# Patient Record
Sex: Female | Born: 1935 | Race: White | Hispanic: No | Marital: Single | Smoking: Never smoker
Health system: Southern US, Community
[De-identification: ages and names within clinical notes are randomized; demographics above are authoritative.]

## PROBLEM LIST (undated history)

## (undated) DIAGNOSIS — Z972 Presence of dental prosthetic device (complete) (partial): Secondary | ICD-10-CM

## (undated) DIAGNOSIS — I2510 Atherosclerotic heart disease of native coronary artery without angina pectoris: Secondary | ICD-10-CM

## (undated) DIAGNOSIS — I10 Essential (primary) hypertension: Secondary | ICD-10-CM

## (undated) DIAGNOSIS — M129 Arthropathy, unspecified: Secondary | ICD-10-CM

## (undated) DIAGNOSIS — I509 Heart failure, unspecified: Secondary | ICD-10-CM

## (undated) DIAGNOSIS — E119 Type 2 diabetes mellitus without complications: Secondary | ICD-10-CM

## (undated) DIAGNOSIS — I4891 Unspecified atrial fibrillation: Secondary | ICD-10-CM

## (undated) DIAGNOSIS — M810 Age-related osteoporosis without current pathological fracture: Secondary | ICD-10-CM

## (undated) DIAGNOSIS — M1990 Unspecified osteoarthritis, unspecified site: Secondary | ICD-10-CM

## (undated) HISTORY — PX: HX HYSTERECTOMY: SHX81

## (undated) HISTORY — PX: HX TONSILLECTOMY: SHX27

## (undated) HISTORY — PX: HX GALL BLADDER SURGERY/CHOLE: SHX55

## (undated) HISTORY — PX: HX APPENDECTOMY: SHX54

## (undated) HISTORY — PX: HX HEART CATHETERIZATION: SHX148

## (undated) HISTORY — PX: CORONARY ARTERY ANGIOPLASTY: PR CATH30428

## (undated) HISTORY — PX: HX CATARACT REMOVAL: SHX102

## (undated) HISTORY — PX: HX ADENOIDECTOMY: SHX29

## (undated) HISTORY — PX: HX AORTIC VALVE REPLACEMENT: SHX41

## (undated) HISTORY — PX: CHOLECYSTECTOMY: SHX55

## (undated) HISTORY — PX: CATARACT EXTRACTION: SUR2

## (undated) HISTORY — PX: ABDOMINAL HYSTERECTOMY: SHX81

---

## 2014-07-29 ENCOUNTER — Emergency Department (HOSPITAL_COMMUNITY)
Admission: EM | Admit: 2014-07-29 | Discharge: 2014-07-29 | Disposition: A | Discharge status: Home / Self Care | Payer: Managed Care, Other (non HMO) | Source: Home / Self Care | Attending: Family Medicine | Admitting: Family Medicine

## 2014-07-29 ENCOUNTER — Encounter (HOSPITAL_COMMUNITY): Payer: Self-pay | Admitting: Family Medicine

## 2014-07-29 DIAGNOSIS — M5431 Sciatica, right side: Secondary | ICD-10-CM

## 2014-07-29 HISTORY — DX: Unspecified osteoarthritis, unspecified site: M19.90

## 2014-07-29 HISTORY — DX: Type 2 diabetes mellitus without complications: E11.9

## 2014-07-29 HISTORY — DX: Essential (primary) hypertension: I10

## 2014-07-29 HISTORY — DX: Unspecified atrial fibrillation: I48.91

## 2014-07-29 MED ORDER — METHOCARBAMOL 500 MG PO TABS
500.0000 mg | ORAL_TABLET | Freq: Four times a day (QID) | ORAL | Status: DC | PRN
Start: 2014-07-29 — End: 2014-08-09

## 2014-07-29 MED ORDER — PREDNISONE 50 MG PO TABS
ORAL_TABLET | ORAL | Status: DC
Start: 2014-07-29 — End: 2014-08-09

## 2014-07-29 NOTE — ED Provider Notes (Signed)
CSN: [id number]     Arrival date & time 07/29/14  [id number] History   First MD Initiated Contact with Patient 07/29/14 1113     No chief complaint on file.  (Consider location/radiation/quality/duration/timing/severity/associated sxs/prior Treatment) HPI 10-14 days ago developed R hip pain. Radiates down buttock and R posterior thigh. Tried T#3 prescribed by PCP out of town w/ some benefit. Onset while making twisting movement in the bathroom. Worse w/ certain movements. Ambulation w/o difficulty Denies any loss of sensation or loss of bowel or bladder function. Pain is unchanged.  H/o arthritis in lower back  Takes meloxicam 15mg  daily for other arthritis pain.     Past Medical History  Diagnosis Date  . Arthritis   . Hypertension   . Diabetes mellitus without complication   . A-fib    Past Surgical History  Procedure Laterality Date  . Abdominal hysterectomy    . Cholecystectomy    . Cataract extraction     No family history on file. History  Substance Use Topics  . Smoking status: Never Smoker   . Smokeless tobacco: Not on file  . Alcohol Use: No   OB History    No data available     Review of Systems ,Per HPI with all other pertinent systems negative.  Allergies  Review of patient's allergies indicates not on file.  Home Medications   Prior to Admission medications   Medication Sig Start Date End Date Taking? Authorizing Provider  acetaminophen (TYLENOL) 325 MG tablet Take 650 mg by mouth every 6 (six) hours as needed.   Yes Historical Provider, MD  aspirin 81 MG tablet Take 81 mg by mouth daily.   Yes Historical Provider, MD  Cholecalciferol (VITAMIN D PO) Take 2,000 Units by mouth.   Yes Historical Provider, MD  diltiazem (DILTIAZEM CD) 240 MG 24 hr capsule Take 240 mg by mouth daily.   Yes Historical Provider, MD  Fish Oil OIL by Does not apply route.   Yes Historical Provider, MD  losartan-hydrochlorothiazide (HYZAAR) 100-25 MG per tablet Take 1 tablet by mouth  daily.   Yes Historical Provider, MD  meloxicam (MOBIC) 15 MG tablet Take 15 mg by mouth daily.   Yes Historical Provider, MD  Multiple Vitamins-Minerals (CENTRUM ADULTS) TABS Take by mouth.   Yes Historical Provider, MD  repaglinide (PRANDIN) 0.5 MG tablet Take 0.5 mg by mouth 3 (three) times daily before meals.   Yes Historical Provider, MD  saxagliptin HCl (ONGLYZA) 5 MG TABS tablet Take by mouth daily.   Yes Historical Provider, MD  vitamin C (ASCORBIC ACID) 500 MG tablet Take 500 mg by mouth daily.   Yes Historical Provider, MD  methocarbamol (ROBAXIN) 500 MG tablet Take 1-2 tablets (500-1,000 mg total) by mouth every 6 (six) hours as needed for muscle spasms. 07/29/14   Ozella Rocksavid J Richey Doolittle, MD  predniSONE (DELTASONE) 50 MG tablet Take daily with breakfast 07/29/14   Ozella Rocksavid J Taylynn Easton, MD   BP 170/85 mmHg  Pulse 75  Temp(Src) 97.8 F (36.6 C) (Oral)  Resp 16  SpO2 96% Physical Exam  ED Course  Procedures (including critical care time) Labs Review Labs Reviewed - No data to display  Imaging Review No results found.   MDM   1. Sciatica, right    Mild sciatica Start prednisone and robaxin, and exercises CHeck sugars and stop if hyperglycemia above 200 and symptomatic or above 300.  F/u in ED if symptoms worsen [place] will try to f/u w/ PCP  or Ortho in that area when returns home Precautions given and all questions answered  HTN: likely elevated due to pain and pressure taken while standing. Continue current medications  Shelly Flattenavid Latanza Pfefferkorn, MD Family Medicine 07/29/2014, 11:54 AM       Ozella Rocksavid J Javeah Loeza, MD 07/29/14 1155

## 2014-07-29 NOTE — Discharge Instructions (Signed)
You likely have right sided sciatica This will take time to resolve Please start the exercises outlined below Please start the steroids and muscle relaxers The steroids will make your sugar become elevated. Please stop the steroids if your sugar goes above 300 or above 200 and develop symptoms such as shaking, feeling ill, nausea, headaches.  If you do not improve or get worse (leg weakness), you may need to go to the emergency room for further evaluation and imaging. Eventually this may need formal physical therapy, injections, or possibly surgery.   Sciatica with Rehab The sciatic nerve runs from the back down the leg and is responsible for sensation and control of the muscles in the back (posterior) side of the thigh, lower leg, and foot. Sciatica is a condition that is characterized by inflammation of this nerve.  SYMPTOMS   Signs of nerve damage, including numbness and/or weakness along the posterior side of the lower extremity.  Pain in the back of the thigh that may also travel down the leg.  Pain that worsens when sitting for long periods of time.  Occasionally, pain in the back or buttock. CAUSES  Inflammation of the sciatic nerve is the cause of sciatica. The inflammation is due to something irritating the nerve. Common sources of irritation include:  Sitting for long periods of time.  Direct trauma to the nerve.  Arthritis of the spine.  Herniated or ruptured disk.  Slipping of the vertebrae (spondylolisthesis).  Pressure from soft tissues, such as muscles or ligament-like tissue (fascia). RISK INCREASES WITH:  Sports that place pressure or stress on the spine (football or weightlifting).  Poor strength and flexibility.  Failure to warm up properly before activity.  Family history of low back pain or disk disorders.  Previous back injury or surgery.  Poor body mechanics, especially when lifting, or poor posture. PREVENTION   Warm up and stretch properly  before activity.  Maintain physical fitness:  Strength, flexibility, and endurance.  Cardiovascular fitness.  Learn and use proper technique, especially with posture and lifting. When possible, have coach correct improper technique.  Avoid activities that place stress on the spine. PROGNOSIS If treated properly, then sciatica usually resolves within 6 weeks. However, occasionally surgery is necessary.  RELATED COMPLICATIONS   Permanent nerve damage, including pain, numbness, tingle, or weakness.  Chronic back pain.  Risks of surgery: infection, bleeding, nerve damage, or damage to surrounding tissues. TREATMENT Treatment initially involves resting from any activities that aggravate your symptoms. The use of ice and medication may help reduce pain and inflammation. The use of strengthening and stretching exercises may help reduce pain with activity. These exercises may be performed at home or with referral to a therapist. A therapist may recommend further treatments, such as transcutaneous electronic nerve stimulation (TENS) or ultrasound. Your caregiver may recommend corticosteroid injections to help reduce inflammation of the sciatic nerve. If symptoms persist despite non-surgical (conservative) treatment, then surgery may be recommended. MEDICATION  If pain medication is necessary, then nonsteroidal anti-inflammatory medications, such as aspirin and ibuprofen, or other minor pain relievers, such as acetaminophen, are often recommended.  Do not take pain medication for 7 days before surgery.  Prescription pain relievers may be given if deemed necessary by your caregiver. Use only as directed and only as much as you need.  Ointments applied to the skin may be helpful.  Corticosteroid injections may be given by your caregiver. These injections should be reserved for the most serious cases, because they may only be  given a certain number of times. HEAT AND COLD  Cold treatment  (icing) relieves pain and reduces inflammation. Cold treatment should be applied for 10 to 15 minutes every 2 to 3 hours for inflammation and pain and immediately after any activity that aggravates your symptoms. Use ice packs or massage the area with a piece of ice (ice massage).  Heat treatment may be used prior to performing the stretching and strengthening activities prescribed by your caregiver, physical therapist, or athletic trainer. Use a heat pack or soak the injury in warm water. SEEK MEDICAL CARE IF:  Treatment seems to offer no benefit, or the condition worsens.  Any medications produce adverse side effects. EXERCISES  RANGE OF MOTION (ROM) AND STRETCHING EXERCISES - Sciatica Most people with sciatic will find that their symptoms worsen with either excessive bending forward (flexion) or arching at the low back (extension). The exercises which will help resolve your symptoms will focus on the opposite motion. Your physician, physical therapist or athletic trainer will help you determine which exercises will be most helpful to resolve your low back pain. Do not complete any exercises without first consulting with your clinician. Discontinue any exercises which worsen your symptoms until you speak to your clinician. If you have pain, numbness or tingling which travels down into your buttocks, leg or foot, the goal of the therapy is for these symptoms to move closer to your back and eventually resolve. Occasionally, these leg symptoms will get better, but your low back pain may worsen; this is typically an indication of progress in your rehabilitation. Be certain to be very alert to any changes in your symptoms and the activities in which you participated in the 24 hours prior to the change. Sharing this information with your clinician will allow him/her to most efficiently treat your condition. These exercises may help you when beginning to rehabilitate your injury. Your symptoms may resolve with  or without further involvement from your physician, physical therapist or athletic trainer. While completing these exercises, remember:   Restoring tissue flexibility helps normal motion to return to the joints. This allows healthier, less painful movement and activity.  An effective stretch should be held for at least 30 seconds.  A stretch should never be painful. You should only feel a gentle lengthening or release in the stretched tissue. FLEXION RANGE OF MOTION AND STRETCHING EXERCISES: STRETCH - Flexion, Single Knee to Chest   Lie on a firm bed or floor with both legs extended in front of you.  Keeping one leg in contact with the floor, bring your opposite knee to your chest. Hold your leg in place by either grabbing behind your thigh or at your knee.  Pull until you feel a gentle stretch in your low back. Hold __________ seconds.  Slowly release your grasp and repeat the exercise with the opposite side. Repeat __________ times. Complete this exercise __________ times per day.  STRETCH - Flexion, Double Knee to Chest  Lie on a firm bed or floor with both legs extended in front of you.  Keeping one leg in contact with the floor, bring your opposite knee to your chest.  Tense your stomach muscles to support your back and then lift your other knee to your chest. Hold your legs in place by either grabbing behind your thighs or at your knees.  Pull both knees toward your chest until you feel a gentle stretch in your low back. Hold __________ seconds.  Tense your stomach muscles and slowly return  one leg at a time to the floor. Repeat __________ times. Complete this exercise __________ times per day.  STRETCH - Low Trunk Rotation   Lie on a firm bed or floor. Keeping your legs in front of you, bend your knees so they are both pointed toward the ceiling and your feet are flat on the floor.  Extend your arms out to the side. This will stabilize your upper body by keeping your  shoulders in contact with the floor.  Gently and slowly drop both knees together to one side until you feel a gentle stretch in your low back. Hold for __________ seconds.  Tense your stomach muscles to support your low back as you bring your knees back to the starting position. Repeat the exercise to the other side. Repeat __________ times. Complete this exercise __________ times per day  EXTENSION RANGE OF MOTION AND FLEXIBILITY EXERCISES: STRETCH - Extension, Prone on Elbows  Lie on your stomach on the floor, a bed will be too soft. Place your palms about shoulder width apart and at the height of your head.  Place your elbows under your shoulders. If this is too painful, stack pillows under your chest.  Allow your body to relax so that your hips drop lower and make contact more completely with the floor.  Hold this position for __________ seconds.  Slowly return to lying flat on the floor. Repeat __________ times. Complete this exercise __________ times per day.  RANGE OF MOTION - Extension, Prone Press Ups  Lie on your stomach on the floor, a bed will be too soft. Place your palms about shoulder width apart and at the height of your head.  Keeping your back as relaxed as possible, slowly straighten your elbows while keeping your hips on the floor. You may adjust the placement of your hands to maximize your comfort. As you gain motion, your hands will come more underneath your shoulders.  Hold this position __________ seconds.  Slowly return to lying flat on the floor. Repeat __________ times. Complete this exercise __________ times per day.  STRENGTHENING EXERCISES - Sciatica  These exercises may help you when beginning to rehabilitate your injury. These exercises should be done near your "sweet spot." This is the neutral, low-back arch, somewhere between fully rounded and fully arched, that is your least painful position. When performed in this safe range of motion, these exercises  can be used for people who have either a flexion or extension based injury. These exercises may resolve your symptoms with or without further involvement from your physician, physical therapist or athletic trainer. While completing these exercises, remember:   Muscles can gain both the endurance and the strength needed for everyday activities through controlled exercises.  Complete these exercises as instructed by your physician, physical therapist or athletic trainer. Progress with the resistance and repetition exercises only as your caregiver advises.  You may experience muscle soreness or fatigue, but the pain or discomfort you are trying to eliminate should never worsen during these exercises. If this pain does worsen, stop and make certain you are following the directions exactly. If the pain is still present after adjustments, discontinue the exercise until you can discuss the trouble with your clinician. STRENGTHENING - Deep Abdominals, Pelvic Tilt   Lie on a firm bed or floor. Keeping your legs in front of you, bend your knees so they are both pointed toward the ceiling and your feet are flat on the floor.  Tense your lower abdominal muscles to  press your low back into the floor. This motion will rotate your pelvis so that your tail bone is scooping upwards rather than pointing at your feet or into the floor.  With a gentle tension and even breathing, hold this position for __________ seconds. Repeat __________ times. Complete this exercise __________ times per day.  STRENGTHENING - Abdominals, Crunches   Lie on a firm bed or floor. Keeping your legs in front of you, bend your knees so they are both pointed toward the ceiling and your feet are flat on the floor. Cross your arms over your chest.  Slightly tip your chin down without bending your neck.  Tense your abdominals and slowly lift your trunk high enough to just clear your shoulder blades. Lifting higher can put excessive stress on  the low back and does not further strengthen your abdominal muscles.  Control your return to the starting position. Repeat __________ times. Complete this exercise __________ times per day.  STRENGTHENING - Quadruped, Opposite UE/LE Lift  Assume a hands and knees position on a firm surface. Keep your hands under your shoulders and your knees under your hips. You may place padding under your knees for comfort.  Find your neutral spine and gently tense your abdominal muscles so that you can maintain this position. Your shoulders and hips should form a rectangle that is parallel with the floor and is not twisted.  Keeping your trunk steady, lift your right hand no higher than your shoulder and then your left leg no higher than your hip. Make sure you are not holding your breath. Hold this position __________ seconds.  Continuing to keep your abdominal muscles tense and your back steady, slowly return to your starting position. Repeat with the opposite arm and leg. Repeat __________ times. Complete this exercise __________ times per day.  STRENGTHENING - Abdominals and Quadriceps, Straight Leg Raise   Lie on a firm bed or floor with both legs extended in front of you.  Keeping one leg in contact with the floor, bend the other knee so that your foot can rest flat on the floor.  Find your neutral spine, and tense your abdominal muscles to maintain your spinal position throughout the exercise.  Slowly lift your straight leg off the floor about 6 inches for a count of 15, making sure to not hold your breath.  Still keeping your neutral spine, slowly lower your leg all the way to the floor. Repeat this exercise with each leg __________ times. Complete this exercise __________ times per day. POSTURE AND BODY MECHANICS CONSIDERATIONS - Sciatica Keeping correct posture when sitting, standing or completing your activities will reduce the stress put on different body tissues, allowing injured tissues a  chance to heal and limiting painful experiences. The following are general guidelines for improved posture. Your physician or physical therapist will provide you with any instructions specific to your needs. While reading these guidelines, remember:  The exercises prescribed by your provider will help you have the flexibility and strength to maintain correct postures.  The correct posture provides the optimal environment for your joints to work. All of your joints have less wear and tear when properly supported by a spine with good posture. This means you will experience a healthier, less painful body.  Correct posture must be practiced with all of your activities, especially prolonged sitting and standing. Correct posture is as important when doing repetitive low-stress activities (typing) as it is when doing a single heavy-load activity (lifting). RESTING POSITIONS Consider  which positions are most painful for you when choosing a resting position. If you have pain with flexion-based activities (sitting, bending, stooping, squatting), choose a position that allows you to rest in a less flexed posture. You would want to avoid curling into a fetal position on your side. If your pain worsens with extension-based activities (prolonged standing, working overhead), avoid resting in an extended position such as sleeping on your stomach. Most people will find more comfort when they rest with their spine in a more neutral position, neither too rounded nor too arched. Lying on a non-sagging bed on your side with a pillow between your knees, or on your back with a pillow under your knees will often provide some relief. Keep in mind, being in any one position for a prolonged period of time, no matter how correct your posture, can still lead to stiffness. PROPER SITTING POSTURE In order to minimize stress and discomfort on your spine, you must sit with correct posture Sitting with good posture should be effortless for  a healthy body. Returning to good posture is a gradual process. Many people can work toward this most comfortably by using various supports until they have the flexibility and strength to maintain this posture on their own. When sitting with proper posture, your ears will fall over your shoulders and your shoulders will fall over your hips. You should use the back of the chair to support your upper back. Your low back will be in a neutral position, just slightly arched. You may place a small pillow or folded towel at the base of your low back for support.  When working at a desk, create an environment that supports good, upright posture. Without extra support, muscles fatigue and lead to excessive strain on joints and other tissues. Keep these recommendations in mind: CHAIR:   A chair should be able to slide under your desk when your back makes contact with the back of the chair. This allows you to work closely.  The chair's height should allow your eyes to be level with the upper part of your monitor and your hands to be slightly lower than your elbows. BODY POSITION  Your feet should make contact with the floor. If this is not possible, use a foot rest.  Keep your ears over your shoulders. This will reduce stress on your neck and low back. INCORRECT SITTING POSTURES   If you are feeling tired and unable to assume a healthy sitting posture, do not slouch or slump. This puts excessive strain on your back tissues, causing more damage and pain. Healthier options include:  Using more support, like a lumbar pillow.  Switching tasks to something that requires you to be upright or walking.  Talking a brief walk.  Lying down to rest in a neutral-spine position. PROLONGED STANDING WHILE SLIGHTLY LEANING FORWARD  When completing a task that requires you to lean forward while standing in one place for a long time, place either foot up on a stationary 2-4 inch high object to help maintain the best  posture. When both feet are on the ground, the low back tends to lose its slight inward curve. If this curve flattens (or becomes too large), then the back and your other joints will experience too much stress, fatigue more quickly and can cause pain.  CORRECT STANDING POSTURES Proper standing posture should be assumed with all daily activities, even if they only take a few moments, like when brushing your teeth. As in sitting, your ears  should fall over your shoulders and your shoulders should fall over your hips. You should keep a slight tension in your abdominal muscles to brace your spine. Your tailbone should point down to the ground, not behind your body, resulting in an over-extended swayback posture.  INCORRECT STANDING POSTURES  Common incorrect standing postures include a forward head, locked knees and/or an excessive swayback. WALKING Walk with an upright posture. Your ears, shoulders and hips should all line-up. PROLONGED ACTIVITY IN A FLEXED POSITION When completing a task that requires you to bend forward at your waist or lean over a low surface, try to find a way to stabilize 3 of 4 of your limbs. You can place a hand or elbow on your thigh or rest a knee on the surface you are reaching across. This will provide you more stability so that your muscles do not fatigue as quickly. By keeping your knees relaxed, or slightly bent, you will also reduce stress across your low back. CORRECT LIFTING TECHNIQUES DO :   Assume a wide stance. This will provide you more stability and the opportunity to get as close as possible to the object which you are lifting.  Tense your abdominals to brace your spine; then bend at the knees and hips. Keeping your back locked in a neutral-spine position, lift using your leg muscles. Lift with your legs, keeping your back straight.  Test the weight of unknown objects before attempting to lift them.  Try to keep your elbows locked down at your sides in order get  the best strength from your shoulders when carrying an object.  Always ask for help when lifting heavy or awkward objects. INCORRECT LIFTING TECHNIQUES DO NOT:   Lock your knees when lifting, even if it is a small object.  Bend and twist. Pivot at your feet or move your feet when needing to change directions.  Assume that you cannot safely pick up a paperclip without proper posture. Document Released: 08/15/2005 Document Revised: 12/30/2013 Document Reviewed: 11/27/2008 [facility] Patient Information 2015 [place], [place]. This information is not intended to replace advice given to you by your health care provider. Make sure you discuss any questions you have with your health care provider.

## 2014-08-09 ENCOUNTER — Encounter (HOSPITAL_COMMUNITY): Payer: Self-pay | Admitting: Emergency Medicine

## 2014-08-09 ENCOUNTER — Emergency Department (HOSPITAL_COMMUNITY)
Admission: EM | Admit: 2014-08-09 | Discharge: 2014-08-09 | Disposition: A | Discharge status: Home / Self Care | Payer: Managed Care, Other (non HMO) | Source: Home / Self Care | Attending: Family Medicine | Admitting: Family Medicine

## 2014-08-09 DIAGNOSIS — M25551 Pain in right hip: Secondary | ICD-10-CM

## 2014-08-09 DIAGNOSIS — M1990 Unspecified osteoarthritis, unspecified site: Secondary | ICD-10-CM

## 2014-08-09 DIAGNOSIS — M1611 Unilateral primary osteoarthritis, right hip: Secondary | ICD-10-CM

## 2014-08-09 DIAGNOSIS — M461 Sacroiliitis, not elsewhere classified: Secondary | ICD-10-CM

## 2014-08-09 MED ORDER — PREDNISONE 20 MG PO TABS: ORAL_TABLET | ORAL | Status: AC

## 2014-08-09 MED ORDER — METHOCARBAMOL 500 MG PO TABS: 500.0000 mg | ORAL_TABLET | Freq: Four times a day (QID) | ORAL | Status: AC

## 2014-08-09 NOTE — ED Notes (Signed)
C/o persistent lower back pain on right side; seen here on 12/01 Dx w/Sciatica; given prednisone and Robaxin w/no temp relief Alert, no signs of acute distress.

## 2014-08-09 NOTE — ED Provider Notes (Signed)
CSN: [id number]     Arrival date & time 08/09/14  1015 History   First MD Initiated Contact with Patient 08/09/14 1040     Chief Complaint  Patient presents with  . Back Pain   (Consider location/radiation/quality/duration/timing/severity/associated sxs/prior Treatment) HPI Comments: 80 year old well-preserved and self-sufficient female is complaining of persistent right posterior and lateral hip pain since mid November. She was seen in this urgent care on December 1 and was diagnosed with sciatica. She was prescribed prednisone and Robaxin which gave her good temporary relief. She is currently out of this medication and now complains of pain only upon arising in the morning when she goes to the bathroom. The pain last for up to an hour before resolving. During the day and early at night she does not experience pain. She is ambulatory with minimal discomfort to the anterolateral hip line. She is able to bear full weight.   Past Medical History  Diagnosis Date  . Arthritis   . Hypertension   . Diabetes mellitus without complication   . A-fib    Past Surgical History  Procedure Laterality Date  . Abdominal hysterectomy    . Cholecystectomy    . Cataract extraction     No family history on file. History  Substance Use Topics  . Smoking status: Never Smoker   . Smokeless tobacco: Not on file  . Alcohol Use: No   OB History    No data available     Review of Systems  Constitutional: Negative.   HENT: Negative.   Cardiovascular: Negative for chest pain.  Musculoskeletal: Positive for arthralgias. Negative for back pain and joint swelling.  Skin: Negative.   Neurological: Negative.     Allergies  Review of patient's allergies indicates no known allergies.  Home Medications   Prior to Admission medications   Medication Sig Start Date End Date Taking? Authorizing Provider  aspirin 81 MG tablet Take 81 mg by mouth daily.   Yes Historical Provider, MD  diltiazem (DILTIAZEM  CD) 240 MG 24 hr capsule Take 240 mg by mouth daily.   Yes Historical Provider, MD  Fish Oil OIL by Does not apply route.   Yes Historical Provider, MD  acetaminophen (TYLENOL) 325 MG tablet Take 650 mg by mouth every 6 (six) hours as needed.    Historical Provider, MD  Cholecalciferol (VITAMIN D PO) Take 2,000 Units by mouth.    Historical Provider, MD  losartan-hydrochlorothiazide (HYZAAR) 100-25 MG per tablet Take 1 tablet by mouth daily.    Historical Provider, MD  meloxicam (MOBIC) 15 MG tablet Take 15 mg by mouth daily.    Historical Provider, MD  methocarbamol (ROBAXIN) 500 MG tablet Take 1 tablet (500 mg total) by mouth 4 (four) times daily. 08/09/14   Hayden Rasmussenavid Donavin Audino, NP  Multiple Vitamins-Minerals (CENTRUM ADULTS) TABS Take by mouth.    Historical Provider, MD  predniSONE (DELTASONE) 20 MG tablet 2 tablets po q d for 4 days, then 1 tablet q d for 3 days . Take with food. 08/09/14   Hayden Rasmussenavid Artemio Dobie, NP  repaglinide (PRANDIN) 0.5 MG tablet Take 0.5 mg by mouth 3 (three) times daily before meals.    Historical Provider, MD  saxagliptin HCl (ONGLYZA) 5 MG TABS tablet Take by mouth daily.    Historical Provider, MD  vitamin C (ASCORBIC ACID) 500 MG tablet Take 500 mg by mouth daily.    Historical Provider, MD   BP 145/64 mmHg  Pulse 90  Temp(Src) 98 F (36.7 C) (Oral)  Resp 16  SpO2 98% Physical Exam  Constitutional: She is oriented to person, place, and time. She appears well-developed and well-nourished. No distress.  Neck: Normal range of motion. Neck supple.  Pulmonary/Chest: Effort normal. No respiratory distress.  Musculoskeletal: She exhibits tenderness. She exhibits no edema.  Tenderness along the right mid sacroiliac joint. Pain radiates from the midline of the right buttock laterally to the hip. The patient denies a vertical line of pain that extends into the leg. There is point tenderness over the sacroiliac joint and lesser over the greater trochanter. No deformity. Distal  neurovascular motor Sentry is intact.  Neurological: She is alert and oriented to person, place, and time. She exhibits normal muscle tone.  Skin: Skin is warm and dry.  Psychiatric: She has a normal mood and affect.  Nursing note and vitals reviewed.   ED Course  Procedures (including critical care time) Labs Review Labs Reviewed - No data to display  Imaging Review No results found.   MDM   1. Right hip pain   2. Arthritis of right hip   3. Sacroiliitis    Offered injection of sterioid and marcaine, pt declined. Requests additional prednisone and robaxin since it works so well.  Discussed adverse effects of prolonged prednisone with sig other present, st she understands. Differential includes sciatica, sacroiliac joint inflammation, bursitis and arthritis. She will need to follow-up with her PCP in [place].      Hayden Rasmussenavid Seda Kronberg, NP 08/09/14 1115

## 2014-08-09 NOTE — Discharge Instructions (Signed)
Arthralgia °Your caregiver has diagnosed you as suffering from an arthralgia. Arthralgia means there is pain in a joint. This can come from many reasons including: °· Bruising the joint which causes soreness (inflammation) in the joint. °· Wear and tear on the joints which occur as we grow older (osteoarthritis). °· Overusing the joint. °· Various forms of arthritis. °· Infections of the joint. °Regardless of the cause of pain in your joint, most of these different pains respond to anti-inflammatory drugs and rest. The exception to this is when a joint is infected, and these cases are treated with antibiotics, if it is a bacterial infection. °HOME CARE INSTRUCTIONS  °· Rest the injured area for as long as directed by your caregiver. Then slowly start using the joint as directed by your caregiver and as the pain allows. Crutches as directed may be useful if the ankles, knees or hips are involved. If the knee was splinted or casted, continue use and care as directed. If an stretchy or elastic wrapping bandage has been applied today, it should be removed and re-applied every 3 to 4 hours. It should not be applied tightly, but firmly enough to keep swelling down. Watch toes and feet for swelling, bluish discoloration, coldness, numbness or excessive pain. If any of these problems (symptoms) occur, remove the ace bandage and re-apply more loosely. If these symptoms persist, contact your caregiver or return to this location. °· For the first 24 hours, keep the injured extremity elevated on pillows while lying down. °· Apply ice for 15-20 minutes to the sore joint every couple hours while awake for the first half day. Then 03-04 times per day for the first 48 hours. Put the ice in a plastic bag and place a towel between the bag of ice and your skin. °· Wear any splinting, casting, elastic bandage applications, or slings as instructed. °· Only take over-the-counter or prescription medicines for pain, discomfort, or fever as  directed by your caregiver. Do not use aspirin immediately after the injury unless instructed by your physician. Aspirin can cause increased bleeding and bruising of the tissues. °· If you were given crutches, continue to use them as instructed and do not resume weight bearing on the sore joint until instructed. °Persistent pain and inability to use the sore joint as directed for more than 2 to 3 days are warning signs indicating that you should see a caregiver for a follow-up visit as soon as possible. Initially, a hairline fracture (break in bone) may not be evident on X-rays. Persistent pain and swelling indicate that further evaluation, non-weight bearing or use of the joint (use of crutches or slings as instructed), or further X-rays are indicated. X-rays may sometimes not show a small fracture until a week or 10 days later. Make a follow-up appointment with your own caregiver or one to whom we have referred you. A radiologist (specialist in reading X-rays) may read your X-rays. Make sure you know how you are to obtain your X-ray results. Do not assume everything is normal if you do not hear from us. °SEEK MEDICAL CARE IF: °Bruising, swelling, or pain increases. °SEEK IMMEDIATE MEDICAL CARE IF:  °· Your fingers or toes are numb or blue. °· The pain is not responding to medications and continues to stay the same or get worse. °· The pain in your joint becomes severe. °· You develop a fever over 102° F (38.9° C). °· It becomes impossible to move or use the joint. °MAKE SURE YOU:  °·   Understand these instructions. °· Will watch your condition. °· Will get help right away if you are not doing well or get worse. °Document Released: 08/15/2005 Document Revised: 11/07/2011 Document Reviewed: 04/02/2008 °ExitCare® Patient Information ©2015 [employer]. This information is not intended to replace advice given to you by your health care provider. Make sure you discuss any questions you have with your health care  provider. ° °Hip Pain °Your hip is the joint between your upper legs and your lower pelvis. The bones, cartilage, tendons, and muscles of your hip joint perform a lot of work each day supporting your body weight and allowing you to move around. °Hip pain can range from a minor ache to severe pain in one or both of your hips. Pain may be felt on the inside of the hip joint near the groin, or the outside near the buttocks and upper thigh. You may have swelling or stiffness as well.  °HOME CARE INSTRUCTIONS  °· Take medicines only as directed by your health care provider. °· Apply ice to the injured area: °¨ Put ice in a plastic bag. °¨ Place a towel between your skin and the bag. °¨ Leave the ice on for 15-20 minutes at a time, 3-4 times a day. °· Keep your leg raised (elevated) when possible to lessen swelling. °· Avoid activities that cause pain. °· Follow specific exercises as directed by your health care provider. °· Sleep with a pillow between your legs on your most comfortable side. °· Record how often you have hip pain, the location of the pain, and what it feels like. °SEEK MEDICAL CARE IF:  °· You are unable to put weight on your leg. °· Your hip is red or swollen or very tender to touch. °· Your pain or swelling continues or worsens after 1 week. °· You have increasing difficulty walking. °· You have a fever. °SEEK IMMEDIATE MEDICAL CARE IF:  °· You have fallen. °· You have a sudden increase in pain and swelling in your hip. °MAKE SURE YOU:  °· Understand these instructions. °· Will watch your condition. °· Will get help right away if you are not doing well or get worse. °Document Released: 02/02/2010 Document Revised: 12/30/2013 Document Reviewed: 04/11/2013 °ExitCare® Patient Information ©2015 [employer]. This information is not intended to replace advice given to you by your health care provider. Make sure you discuss any questions you have with your health care provider. ° °

## 2014-08-18 ENCOUNTER — Emergency Department (HOSPITAL_COMMUNITY): Payer: Medicare HMO

## 2014-08-18 ENCOUNTER — Emergency Department (HOSPITAL_COMMUNITY)
Admission: EM | Admit: 2014-08-18 | Discharge: 2014-08-18 | Disposition: A | Discharge status: Home / Self Care | Payer: Medicare HMO | Attending: Emergency Medicine | Admitting: Emergency Medicine

## 2014-08-18 ENCOUNTER — Encounter (HOSPITAL_COMMUNITY): Payer: Self-pay | Admitting: Adult Health

## 2014-08-18 DIAGNOSIS — Z7952 Long term (current) use of systemic steroids: Secondary | ICD-10-CM | POA: Diagnosis not present

## 2014-08-18 DIAGNOSIS — Z791 Long term (current) use of non-steroidal anti-inflammatories (NSAID): Secondary | ICD-10-CM | POA: Diagnosis not present

## 2014-08-18 DIAGNOSIS — M1990 Unspecified osteoarthritis, unspecified site: Secondary | ICD-10-CM | POA: Insufficient documentation

## 2014-08-18 DIAGNOSIS — I10 Essential (primary) hypertension: Secondary | ICD-10-CM | POA: Diagnosis not present

## 2014-08-18 DIAGNOSIS — R52 Pain, unspecified: Secondary | ICD-10-CM

## 2014-08-18 DIAGNOSIS — M25551 Pain in right hip: Secondary | ICD-10-CM | POA: Insufficient documentation

## 2014-08-18 DIAGNOSIS — Z7982 Long term (current) use of aspirin: Secondary | ICD-10-CM | POA: Insufficient documentation

## 2014-08-18 DIAGNOSIS — E119 Type 2 diabetes mellitus without complications: Secondary | ICD-10-CM | POA: Insufficient documentation

## 2014-08-18 DIAGNOSIS — Z79899 Other long term (current) drug therapy: Secondary | ICD-10-CM | POA: Insufficient documentation

## 2014-08-18 HISTORY — DX: Age-related osteoporosis without current pathological fracture: M81.0

## 2014-08-18 MED ORDER — ACETAMINOPHEN 500 MG PO TABS
1000.0000 mg | ORAL_TABLET | Freq: Once | ORAL | Status: AC
  Administered 2014-08-18: 1000 mg via ORAL
  Filled 2014-08-18: qty 2

## 2014-08-18 MED ORDER — IBUPROFEN 800 MG PO TABS: 800.0000 mg | ORAL_TABLET | Freq: Three times a day (TID) | ORAL | Status: AC

## 2014-08-18 NOTE — ED Notes (Signed)
To x-ray

## 2014-08-18 NOTE — ED Notes (Signed)
Presents with right hip pain that began this AM, she was recently treated for Sciatica and finished a round of steroids. THis am upon waking she had to crawl from bed to bathroom due to pressure in right hip when standing, pain goes from mid thigh to right hip. Pt denies injury. CMS intact

## 2014-08-18 NOTE — ED Notes (Signed)
The pt has severe pain in her rt hio and rt foot especially with weight bearing.  She just finished   A prednisone rx

## 2014-08-18 NOTE — Discharge Instructions (Signed)
Iliotibial Band Syndrome  Mrs. Sherri Wyatt, you were seen today for leg pain. X-rays negative for any injury. Take Motrin as prescribed for any worsening return to emergency department immediately for repeat evaluation. Iliotibial band syndrome is pain in the outer, lower thigh. The pain is caused by an inflammation of the iliotibial band. This is a band of thick fibrous tissue that runs down the outside of the thigh. The iliotibial band begins at the hip. It extends to the outer side of the shin bone (tibia) just below the knee joint. The band works with the thigh muscles. Together they provide stability to the outside of the knee joint. Iliotibial band syndrome occurs when there is inflammation to this band of tissue. This is typically due to over use and not due to an injury. The irritation usually occurs over the outside of the knee joint, at the the end of the thigh bone (femur). The iliotibial band crosses bone and muscle at this point. Between these structures is a cushioning sac (bursa). The bursa should make possible a smooth gliding motion. However, when inflamed, the iliotibial band does not glide easily. When inflamed, there is pain with motion of the knee. Usually the pain worsens with continued movement and the pain goes away with rest. This problem usually arises when there is a sudden increase in sports activities involving your legs. Running, and playing soccer or basketball are examples of activities causing this. Others who are prone to iliotibial band syndrome include individuals with mechanical problems such as leg length differences, abnormality of walking, bowed legs etc. HOME CARE INSTRUCTIONS   Apply ice to the injured area:  Put ice in a plastic bag.  Place a towel between your skin and the bag.  Leave the ice on for 20 minutes, 2-3 times a day.  Limit excessive training or eliminate training until pain goes away.  While pain is present, you may use gentle range of motion. Do  not resume regular use until instructed by your health care provider. Begin use gradually. Do not increase activity to the point of pain. If pain does develop, decrease activity and continue the above measures. Gradually increase activities that do not cause discomfort. Do this until you finally achieve normal use.  Perform low-impact activities while pain is present. Wear proper footwear.  Only take over-the-counter or prescription medicines for pain, discomfort, or fever as directed by your health care provider. SEEK MEDICAL CARE IF:   Your pain increases or pain is not controlled with medications.  You develop new, unexplained symptoms, or an increase of the symptoms that brought you to your health care provider.  Your pain and symptoms are not improving or are getting worse. Document Released: 02/04/2002 Document Revised: 06/05/2013 Document Reviewed: 03/14/2013 [facility] Patient Information 2015 [place], [place]. This information is not intended to replace advice given to you by your health care provider. Make sure you discuss any questions you have with your health care provider.

## 2014-08-18 NOTE — ED Provider Notes (Signed)
CSN: [id number]     Arrival date & time 08/18/14  0303 History  This chart was scribed for Tomasita Crumble, MD by Karle Plumber, ED Scribe. This patient was seen in room A03C/A03C and the patient's care was started at 3:23 AM.  Chief Complaint  Patient presents with  . Hip Pain   The history is provided by the patient. No language interpreter was used.    HPI Comments:  Sherri Wyatt is a 80 y.o. female with PMH of sciatica (left side) who presents to the Emergency Department complaining of severe right hip pain that radiates to her mid right thigh onset earlier this morning. States she had to crawl from the bed to bathroom secondary to pain. Pt reports she has had similar symptoms that were mainly located in her right-sided back in which she was prescribed two courses of steroids and muscle relaxer from urgent care that she finished two days ago. Denies taking any OTC pain medications. She reports significant relief of the pain with the medications. This has occurred twice now, the patient is hesitant to take chronic steroids due to her osteoporosis. Denies trauma, injury or fall. Denies numbness, tingling or weakness of the lower extremities or urinary symptoms. PMH of arthritis, HTM, DM and osteoporosis. Pt is visiting from [place] but reports that she has a PCP there.  Past Medical History  Diagnosis Date  . Arthritis   . Hypertension   . Diabetes mellitus without complication   . A-fib   . Osteoporosis    Past Surgical History  Procedure Laterality Date  . Abdominal hysterectomy    . Cholecystectomy    . Cataract extraction     History reviewed. No pertinent family history. History  Substance Use Topics  . Smoking status: Never Smoker   . Smokeless tobacco: Not on file  . Alcohol Use: No   OB History    No data available     Review of Systems  Genitourinary: Negative for dysuria, frequency, hematuria, decreased urine volume and difficulty urinating.   Musculoskeletal: Positive for arthralgias.  Neurological: Negative for weakness and numbness.  All other systems reviewed and are negative.   Allergies  Review of patient's allergies indicates no known allergies.  Home Medications   Prior to Admission medications   Medication Sig Start Date End Date Taking? Authorizing Provider  aspirin 81 MG tablet Take 81 mg by mouth daily.   Yes Historical Provider, MD  Cholecalciferol (VITAMIN D PO) Take 2,000 Units by mouth.   Yes Historical Provider, MD  diltiazem (DILTIAZEM CD) 240 MG 24 hr capsule Take 240 mg by mouth daily.   Yes Historical Provider, MD  Fish Oil OIL Take 2 capsules by mouth daily.    Yes Historical Provider, MD  losartan-hydrochlorothiazide (HYZAAR) 100-25 MG per tablet Take 1 tablet by mouth daily.   Yes Historical Provider, MD  meloxicam (MOBIC) 15 MG tablet Take 15 mg by mouth daily.   Yes Historical Provider, MD  methocarbamol (ROBAXIN) 500 MG tablet Take 1 tablet (500 mg total) by mouth 4 (four) times daily. 08/09/14  Yes Hayden Rasmussen, NP  Multiple Vitamins-Minerals (CENTRUM ADULTS) TABS Take 1 tablet by mouth daily.    Yes Historical Provider, MD  repaglinide (PRANDIN) 0.5 MG tablet Take 0.5 mg by mouth 3 (three) times daily before meals.   Yes Historical Provider, MD  saxagliptin HCl (ONGLYZA) 5 MG TABS tablet Take 5 mg by mouth daily.    Yes Historical Provider, MD  vitamin C (ASCORBIC  ACID) 500 MG tablet Take 1,000 mg by mouth daily.    Yes Historical Provider, MD  acetaminophen (TYLENOL) 325 MG tablet Take 650 mg by mouth every 6 (six) hours as needed (pain).     Historical Provider, MD  predniSONE (DELTASONE) 20 MG tablet 2 tablets po q d for 4 days, then 1 tablet q d for 3 days . Take with food. Patient not taking: Reported on 08/18/2014 08/09/14   Hayden Rasmussenavid Mabe, NP   Triage Vitals: BP 159/71 mmHg  Pulse 63  Temp(Src) 97.3 F (36.3 C) (Oral)  Resp 18  SpO2 95% Physical Exam  Constitutional: She is oriented to  person, place, and time. She appears well-developed and well-nourished. No distress.  HENT:  Head: Normocephalic and atraumatic.  Eyes: Conjunctivae and EOM are normal. Pupils are equal, round, and reactive to light. No scleral icterus.  Neck: Normal range of motion. Neck supple. No JVD present. No tracheal deviation present. No thyromegaly present.  Cardiovascular: Normal rate, regular rhythm and normal heart sounds.  Exam reveals no gallop and no friction rub.   No murmur heard. Pulmonary/Chest: Effort normal and breath sounds normal. No respiratory distress. She has no wheezes. She exhibits no tenderness.  Abdominal: Soft. Bowel sounds are normal. She exhibits no distension and no mass. There is no tenderness. There is no rebound and no guarding.  Musculoskeletal: Normal range of motion. She exhibits tenderness. She exhibits no edema.  Dennis to palpation of the right iliotibial track  Lymphadenopathy:    She has no cervical adenopathy.  Neurological: She is alert and oriented to person, place, and time.  Skin: Skin is warm and dry. No rash noted. No erythema. No pallor.  Nursing note and vitals reviewed.   ED Course  Procedures (including critical care time) DIAGNOSTIC STUDIES: Oxygen Saturation is 95% on RA, adequate by my interpretation.   COORDINATION OF CARE: 3:33 AM- Will X-Ray right hip and order Tylenol. Pt verbalizes understanding and agrees to plan.  Medications  acetaminophen (TYLENOL) tablet 1,000 mg (1,000 mg Oral Given 08/18/14 0403)   Labs Review Labs Reviewed - No data to display  Imaging Review Dg Hip Complete Right  08/18/2014   CLINICAL DATA:  Subacute onset of right lateral hip pain. Initial encounter.  EXAM: RIGHT HIP - COMPLETE 2+ VIEW  COMPARISON:  None.  FINDINGS: There is no evidence of fracture or dislocation. Both femoral heads are seated normally within their respective acetabula. The proximal right femur appears intact. Mild degenerative change is  noted at the lower lumbar spine. The sacroiliac joints are unremarkable in appearance.  The visualized bowel gas pattern is grossly unremarkable in appearance. Clips are noted at the mid pelvis. Scattered vascular calcifications are seen.  IMPRESSION: No evidence of fracture or dislocation.   Electronically Signed   By: Roanna RaiderJeffery  Chang M.D.   On: 08/18/2014 03:53     EKG Interpretation None      MDM   Final diagnoses:  Pain    Patient since emergency department out of concern for right leg pain. X-rays negative for any acute pathology. He does not appear to be coming from the hip or the sciatic nerve. Straight leg raise test is negative. I ambulated her in the room and she had sudden pain along her iliotibial track. Due to this I'll prescribe her Motrin, she denies any history of chronic kidney disease. She was also advised to rest over the next 2 weeks and [place] Her vital  signs within her normal limits and she is safe for discharge.  I personally performed the services described in this documentation, which was scribed in my presence. The recorded information has been reviewed and is accurate.    Tomasita CrumbleAdeleke Avenell Sellers, MD 08/18/14 [phone]

## 2015-03-13 ENCOUNTER — Encounter (HOSPITAL_COMMUNITY): Payer: Self-pay | Admitting: Physical Medicine and Rehabilitation

## 2015-03-13 ENCOUNTER — Emergency Department (HOSPITAL_COMMUNITY): Payer: Medicare HMO

## 2015-03-13 ENCOUNTER — Emergency Department (HOSPITAL_COMMUNITY)
Admission: EM | Admit: 2015-03-13 | Discharge: 2015-03-13 | Disposition: A | Discharge status: Home / Self Care | Payer: Medicare HMO | Attending: Emergency Medicine | Admitting: Emergency Medicine

## 2015-03-13 DIAGNOSIS — M1990 Unspecified osteoarthritis, unspecified site: Secondary | ICD-10-CM | POA: Diagnosis not present

## 2015-03-13 DIAGNOSIS — I10 Essential (primary) hypertension: Secondary | ICD-10-CM | POA: Diagnosis not present

## 2015-03-13 DIAGNOSIS — R55 Syncope and collapse: Secondary | ICD-10-CM | POA: Diagnosis not present

## 2015-03-13 DIAGNOSIS — E119 Type 2 diabetes mellitus without complications: Secondary | ICD-10-CM | POA: Diagnosis not present

## 2015-03-13 DIAGNOSIS — M8580 Other specified disorders of bone density and structure, unspecified site: Secondary | ICD-10-CM | POA: Diagnosis not present

## 2015-03-13 DIAGNOSIS — Z7982 Long term (current) use of aspirin: Secondary | ICD-10-CM | POA: Insufficient documentation

## 2015-03-13 DIAGNOSIS — I4891 Unspecified atrial fibrillation: Secondary | ICD-10-CM | POA: Insufficient documentation

## 2015-03-13 DIAGNOSIS — R42 Dizziness and giddiness: Secondary | ICD-10-CM | POA: Diagnosis present

## 2015-03-13 DIAGNOSIS — Z791 Long term (current) use of non-steroidal anti-inflammatories (NSAID): Secondary | ICD-10-CM | POA: Insufficient documentation

## 2015-03-13 DIAGNOSIS — Z79899 Other long term (current) drug therapy: Secondary | ICD-10-CM | POA: Diagnosis not present

## 2015-03-13 LAB — URINALYSIS, ROUTINE W REFLEX MICROSCOPIC
Bilirubin Urine: NEGATIVE
Glucose, UA: NEGATIVE mg/dL
Hgb urine dipstick: NEGATIVE
KETONES UR: NEGATIVE mg/dL
Leukocytes, UA: NEGATIVE
NITRITE: NEGATIVE
PH: 7 (ref 5.0–8.0)
Protein, ur: NEGATIVE mg/dL
Specific Gravity, Urine: 1.006 (ref 1.005–1.030)
Urobilinogen, UA: 0.2 mg/dL (ref 0.0–1.0)

## 2015-03-13 LAB — CBC WITH DIFFERENTIAL/PLATELET
BASOS PCT: 0 % (ref 0–1)
Basophils Absolute: 0 10*3/uL (ref 0.0–0.1)
EOS PCT: 3 % (ref 0–5)
Eosinophils Absolute: 0.3 10*3/uL (ref 0.0–0.7)
HCT: 38.9 % (ref 36.0–46.0)
Hemoglobin: 12.3 g/dL (ref 12.0–15.0)
Lymphocytes Relative: 23 % (ref 12–46)
Lymphs Abs: 2.1 10*3/uL (ref 0.7–4.0)
MCH: 27.8 pg (ref 26.0–34.0)
MCHC: 31.6 g/dL (ref 30.0–36.0)
MCV: 88 fL (ref 78.0–100.0)
MONO ABS: 0.5 10*3/uL (ref 0.1–1.0)
MONOS PCT: 6 % (ref 3–12)
NEUTROS PCT: 68 % (ref 43–77)
Neutro Abs: 6.1 10*3/uL (ref 1.7–7.7)
Platelets: 313 10*3/uL (ref 150–400)
RBC: 4.42 MIL/uL (ref 3.87–5.11)
RDW: 15.8 % — ABNORMAL HIGH (ref 11.5–15.5)
WBC: 9 10*3/uL (ref 4.0–10.5)

## 2015-03-13 LAB — COMPREHENSIVE METABOLIC PANEL
ALT: 14 U/L (ref 14–54)
AST: 23 U/L (ref 15–41)
Albumin: 3.4 g/dL — ABNORMAL LOW (ref 3.5–5.0)
Alkaline Phosphatase: 46 U/L (ref 38–126)
Anion gap: 10 (ref 5–15)
BUN: 15 mg/dL (ref 6–20)
CO2: 25 mmol/L (ref 22–32)
Calcium: 9.8 mg/dL (ref 8.9–10.3)
Chloride: 102 mmol/L (ref 101–111)
Creatinine, Ser: 0.83 mg/dL (ref 0.44–1.00)
GFR calc Af Amer: >60 mL/min (ref >60)
Glucose, Bld: 184 mg/dL — ABNORMAL HIGH (ref 65–99)
Potassium: 4.2 mmol/L (ref 3.5–5.1)
Sodium: 137 mmol/L (ref 135–145)
Total Bilirubin: 0.7 mg/dL (ref 0.3–1.2)
Total Protein: 6.1 g/dL — ABNORMAL LOW (ref 6.5–8.1)

## 2015-03-13 LAB — I-STAT TROPONIN, ED: Troponin i, poc: 0.01 ng/mL (ref 0.00–0.08)

## 2015-03-13 MED ORDER — SODIUM CHLORIDE 0.9 % IV BOLUS (SEPSIS)
500.0000 mL | Freq: Once | INTRAVENOUS | Status: AC
  Administered 2015-03-13: 500 mL via INTRAVENOUS

## 2015-03-13 NOTE — Discharge Instructions (Signed)

## 2015-03-13 NOTE — ED Provider Notes (Signed)
CSN: [id number]     Arrival date & time 03/13/15  1315 History   First MD Initiated Contact with Patient 03/13/15 1537     Chief Complaint  Patient presents with  . Dizziness  . Near Syncope     (Consider location/radiation/quality/duration/timing/severity/associated sxs/prior Treatment) Patient is a 81 y.o. female presenting with dizziness and near-syncope. The history is provided by the patient.  Dizziness Quality:  Lightheadedness and room spinning Severity:  Mild Onset quality:  Sudden Duration:  7 hours Timing:  Constant Progression:  Improving Chronicity:  New Context comment:  Upon awakening this morning Relieved by:  Nothing Worsened by:  Nothing Ineffective treatments:  None tried Associated symptoms: no chest pain, no diarrhea, no headaches, no nausea, no shortness of breath and no vomiting   Near Syncope Pertinent negatives include no chest pain, no abdominal pain, no headaches and no shortness of breath.    Past Medical History  Diagnosis Date  . Arthritis   . Hypertension   . Diabetes mellitus without complication   . A-fib   . Osteoporosis    Past Surgical History  Procedure Laterality Date  . Abdominal hysterectomy    . Cholecystectomy    . Cataract extraction     No family history on file. History  Substance Use Topics  . Smoking status: Never Smoker   . Smokeless tobacco: Not on file  . Alcohol Use: No   OB History    No data available     Review of Systems  Constitutional: Negative for fever and fatigue.  HENT: Negative for congestion and drooling.   Eyes: Negative for pain.  Respiratory: Negative for cough and shortness of breath.   Cardiovascular: Positive for near-syncope. Negative for chest pain.  Gastrointestinal: Negative for nausea, vomiting, abdominal pain and diarrhea.  Genitourinary: Negative for dysuria and hematuria.  Musculoskeletal: Negative for back pain, gait problem and neck pain.  Skin: Negative for color change.   Neurological: Positive for dizziness. Negative for headaches.  Hematological: Negative for adenopathy.  Psychiatric/Behavioral: Negative for behavioral problems.  All other systems reviewed and are negative.     Allergies  Review of patient's allergies indicates no known allergies.  Home Medications   Prior to Admission medications   Medication Sig Start Date End Date Taking? Authorizing Provider  acetaminophen (TYLENOL) 325 MG tablet Take 650 mg by mouth every 6 (six) hours as needed (pain).     Historical Provider, MD  aspirin 81 MG tablet Take 81 mg by mouth daily.    Historical Provider, MD  Cholecalciferol (VITAMIN D PO) Take 2,000 Units by mouth.    Historical Provider, MD  diltiazem (DILTIAZEM CD) 240 MG 24 hr capsule Take 240 mg by mouth daily.    Historical Provider, MD  Fish Oil OIL Take 2 capsules by mouth daily.     Historical Provider, MD  ibuprofen (ADVIL,MOTRIN) 800 MG tablet Take 1 tablet (800 mg total) by mouth 3 (three) times daily. 08/18/14   Tomasita Crumble, MD  losartan-hydrochlorothiazide (HYZAAR) 100-25 MG per tablet Take 1 tablet by mouth daily.    Historical Provider, MD  meloxicam (MOBIC) 15 MG tablet Take 15 mg by mouth daily.    Historical Provider, MD  methocarbamol (ROBAXIN) 500 MG tablet Take 1 tablet (500 mg total) by mouth 4 (four) times daily. 08/09/14   Hayden Rasmussen, NP  Multiple Vitamins-Minerals (CENTRUM ADULTS) TABS Take 1 tablet by mouth daily.     Historical Provider, MD  predniSONE (DELTASONE) 20 MG tablet  2 tablets po q d for 4 days, then 1 tablet q d for 3 days . Take with food. Patient not taking: Reported on 08/18/2014 08/09/14   Hayden Rasmussen, NP  repaglinide (PRANDIN) 0.5 MG tablet Take 0.5 mg by mouth 3 (three) times daily before meals.    Historical Provider, MD  saxagliptin HCl (ONGLYZA) 5 MG TABS tablet Take 5 mg by mouth daily.     Historical Provider, MD  vitamin C (ASCORBIC ACID) 500 MG tablet Take 1,000 mg by mouth daily.     Historical  Provider, MD   BP 144/54 mmHg  Pulse 68  Temp(Src) 97.6 F (36.4 C) (Oral)  Resp 18  SpO2 99% Physical Exam  Constitutional: She is oriented to person, place, and time. She appears well-developed and well-nourished.  HENT:  Head: Normocephalic.  Mouth/Throat: Oropharynx is clear and moist. No oropharyngeal exudate.  Eyes: Conjunctivae and EOM are normal. Pupils are equal, round, and reactive to light.  Neck: Normal range of motion. Neck supple.  Cardiovascular: Normal rate, regular rhythm, normal heart sounds and intact distal pulses.  Exam reveals no gallop and no friction rub.   No murmur heard. Pulmonary/Chest: Effort normal and breath sounds normal. No respiratory distress. She has no wheezes.  Abdominal: Soft. Bowel sounds are normal. There is no tenderness. There is no rebound and no guarding.  Musculoskeletal: Normal range of motion. She exhibits no edema or tenderness.  Neurological: She is alert and oriented to person, place, and time.  alert, oriented x3 speech: normal in context and clarity memory: intact grossly cranial nerves II-XII: intact motor strength: full proximally and distally  sensation: intact to light touch diffusely  cerebellar: finger-to-nose and heel-to-shin intact gait: normal forwards w/ mild assistance, mild reproduction of dizziness w/ ambulation  Skin: Skin is warm and dry.  Psychiatric: She has a normal mood and affect. Her behavior is normal.  Nursing note and vitals reviewed.   ED Course  Procedures (including critical care time) Labs Review Labs Reviewed  CBC WITH DIFFERENTIAL/PLATELET - Abnormal; Notable for the following:    RDW 15.8 (*)    All other components within normal limits  COMPREHENSIVE METABOLIC PANEL - Abnormal; Notable for the following:    Glucose, Bld 184 (*)    Total Protein 6.1 (*)    Albumin 3.4 (*)    All other components within normal limits  URINALYSIS, ROUTINE W REFLEX MICROSCOPIC (NOT AT [facility])  Rosezena Sensor, ED    Imaging Review Dg Chest 2 View  03/13/2015   CLINICAL DATA:  Shortness of breath, dizziness and diabetes  EXAM: CHEST  2 VIEW  COMPARISON:  None  FINDINGS: The heart size is normal. No pleural effusion or edema. No airspace consolidation. Coarsened interstitial markings including bronchial wall thickening is noted bilaterally. No superimposed airspace consolidation. Aortic atherosclerosis noted.  IMPRESSION: 1. Chronic interstitial coarsening. 2. Aortic atherosclerosis.   Electronically Signed   By: Signa Kell M.D.   On: 03/13/2015 14:08     EKG Interpretation   Date/Time:  Friday March 13 2015 13:20:03 EDT Ventricular Rate:  61 PR Interval:  160 QRS Duration: 86 QT Interval:  452 QTC Calculation: 455 R Axis:   8 Text Interpretation:  Sinus rhythm with occasional Premature ventricular  complexes Possible Left atrial enlargement Left ventricular hypertrophy  with repolarization abnormality Inferior infarct , age undetermined  Anterior infarct , age undetermined Confirmed by Jehan Bonano  MD, Kariya Lavergne  ([id number]) on 03/13/2015 3:39:44 PM  MDM   Final diagnoses:  Dizziness    4:38 PM 81 y.o. female w hx of HTN, DM, afib who presents with dizziness upon awakening this morning. She notes it has slightly resolved but then persisted throughout the day. She is afebrile and vital signs are unremarkable here. She is able to ambulate with mild assistance and does ambulate at home with a walker. We'll get screening labs and imaging.  5:11 PM: I interpreted/reviewed the labs and/or imaging which were non-contributory.  Dizziness has completely resolved after 500 ml IVF bolus. She is ambulatory w/ no dizziness at all and only minimal help. I discussed the possibility of CVA w/ pt and family but felt that it was unlikely given complete resolution of her sx w/ otherwise normal neuro exam. Family feels comfortable going home.  I have discussed the diagnosis/risks/treatment options with  the patient and family and believe the pt to be eligible for discharge home to follow-up with her pcp. We also discussed returning to the ED immediately if new or worsening sx occur. We discussed the sx which are most concerning (e.g., worsening dizziness, weakness, numbness, fever) that necessitate immediate return. Medications administered to the patient during their visit and any new prescriptions provided to the patient are listed below.  Medications given during this visit Medications  sodium chloride 0.9 % bolus 500 mL (0 mLs Intravenous Stopped 03/13/15 1704)    New Prescriptions   No medications on file     Purvis SheffieldForrest Trevia Nop, MD 03/14/15 [phone]

## 2015-03-13 NOTE — ED Notes (Signed)
Pt presents to department for evaluation of dizziness, diaphoresis, and near syncope. Pt states she started feeling bad this morning. Denies chest pain. Pt is alert and oriented x4.

## 2017-03-02 IMAGING — CT CT HEAD W/O CM
1 of 2 series · 16 of 30 positions shown, 20 images · non-contrast
Comparison: None.

CLINICAL DATA: Dizziness.  Diaphoresis.  Near syncope.

EXAM:
CT HEAD WITHOUT CONTRAST
TECHNIQUE: Contiguous axial images were obtained from the base of the skull
through the vertex without intravenous contrast.

[Series 3: head 2.0 h70h · axial · 0.41mm/px · z∈[-176,-52]mm · 16 of 70 slices shown, 20 images]
[im 4/70  brain]
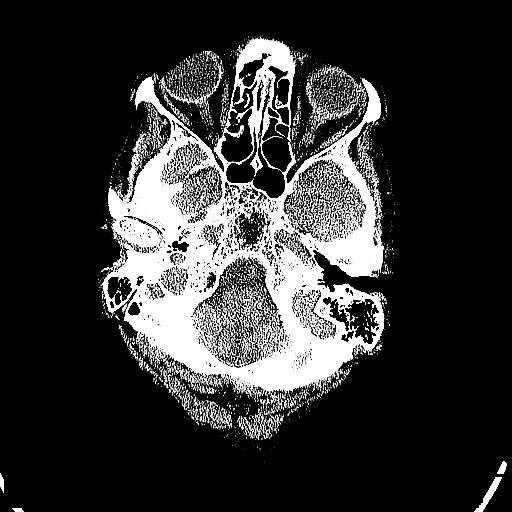
[im 4/70  bone]
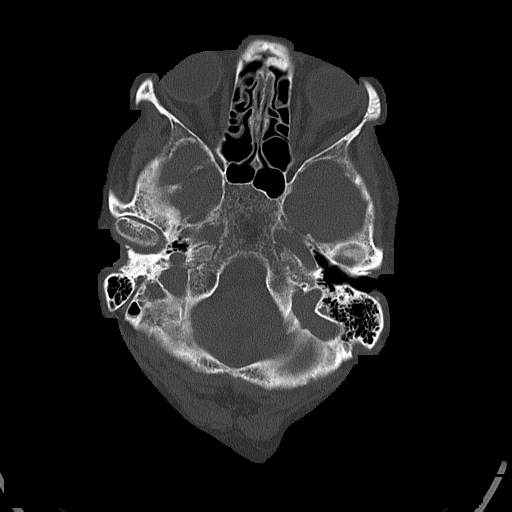
[im 7/70  brain]
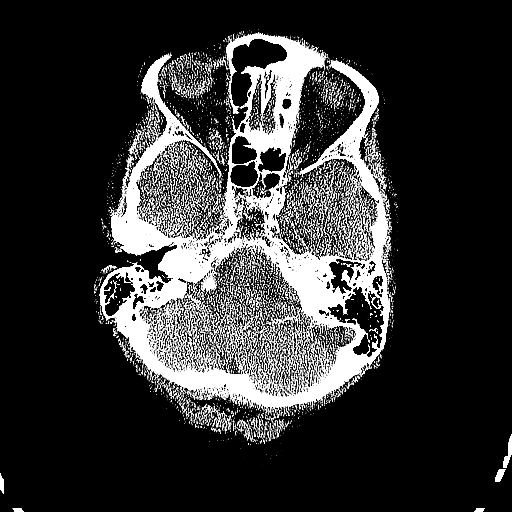
[im 11/70  brain]
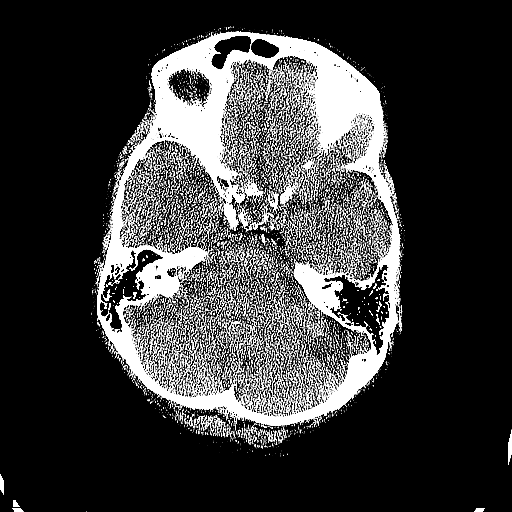
[im 18/70  brain]
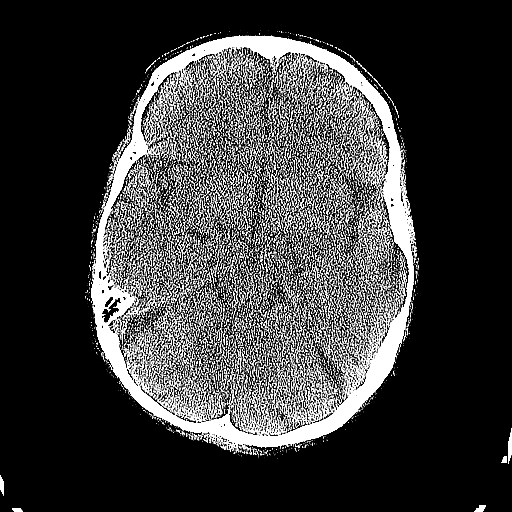
[im 21/70  brain]
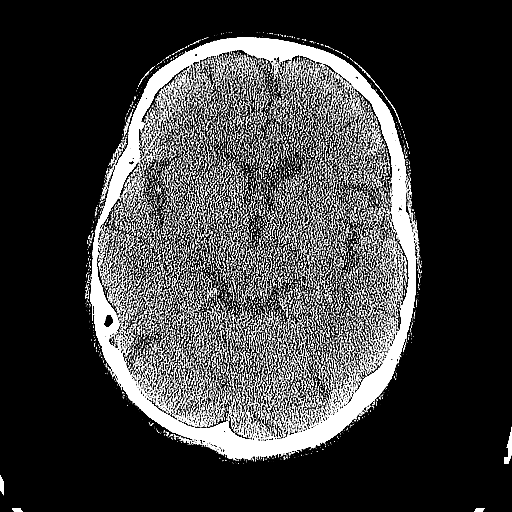
[im 21/70  bone]
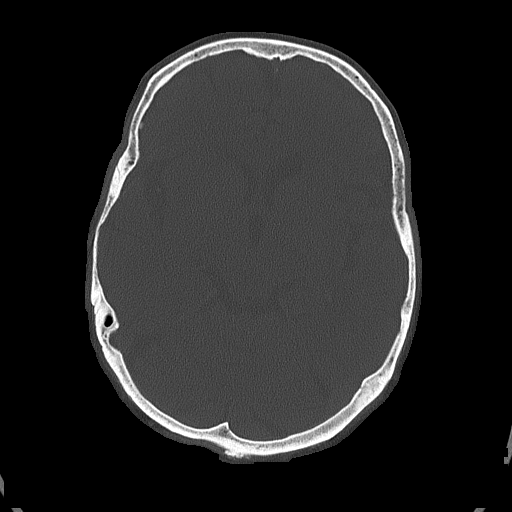
[im 25/70  brain]
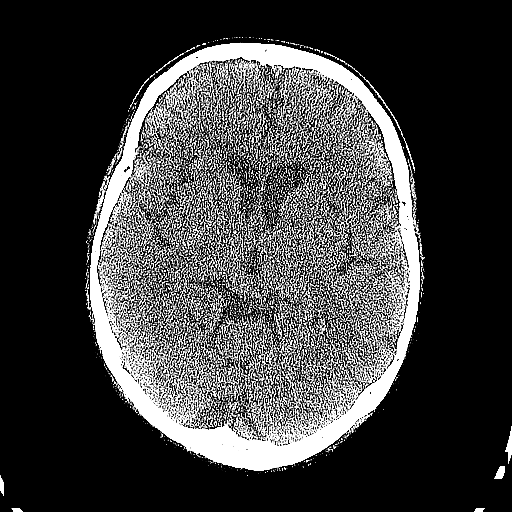
[im 28/70  brain]
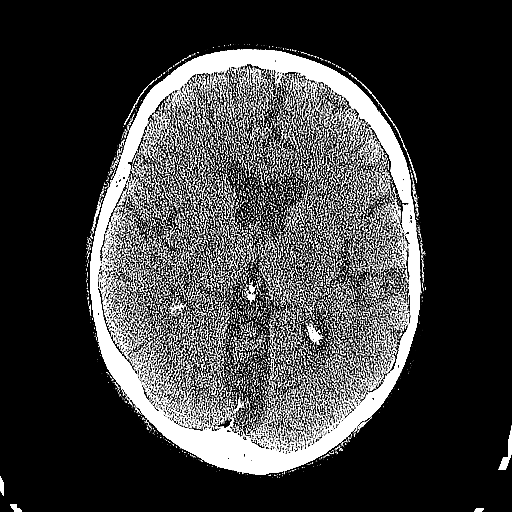
[im 32/70  brain]
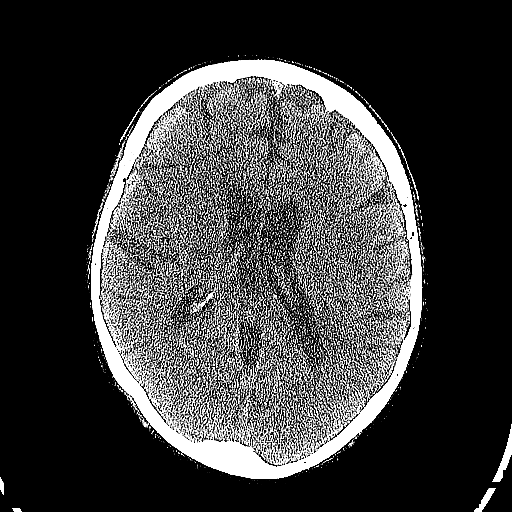
[im 38/70  brain]
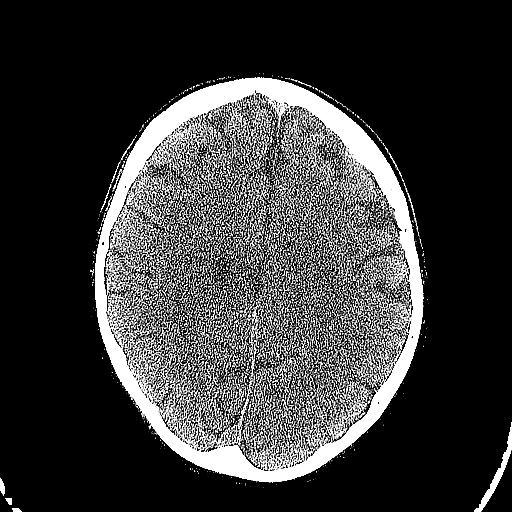
[im 38/70  bone]
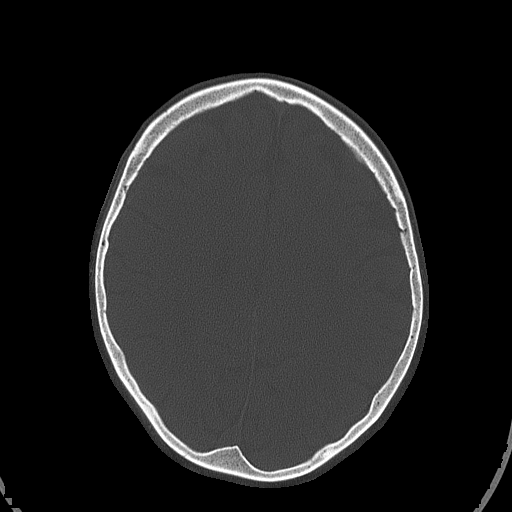
[im 42/70  brain]
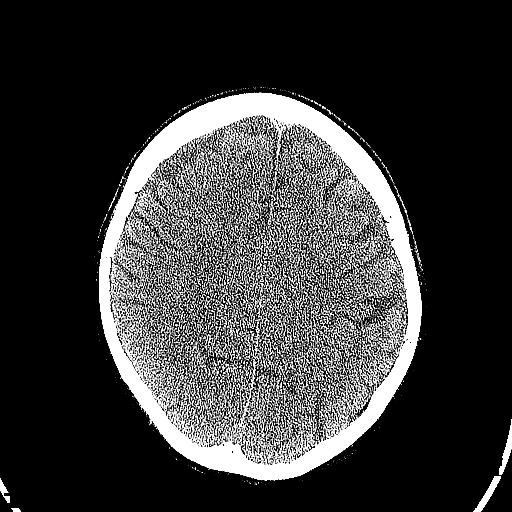
[im 45/70  brain]
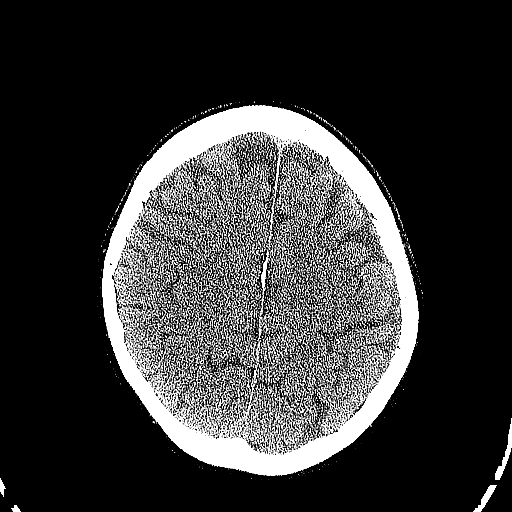
[im 49/70  brain]
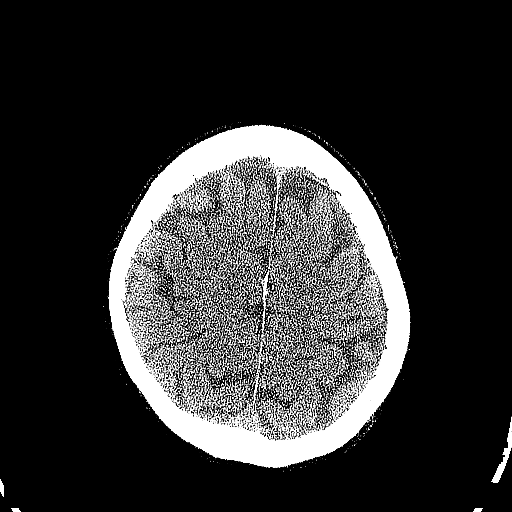
[im 52/70  brain]
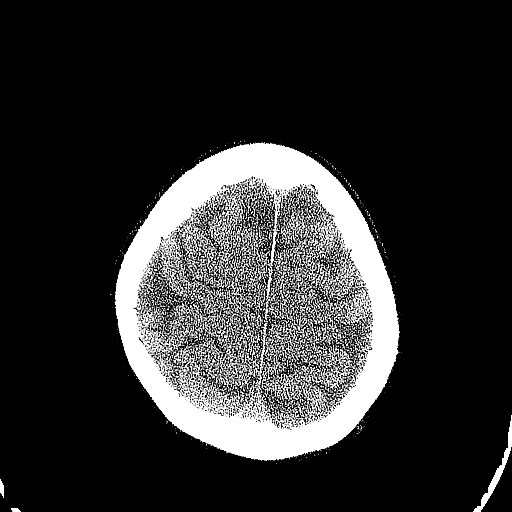
[im 52/70  bone]
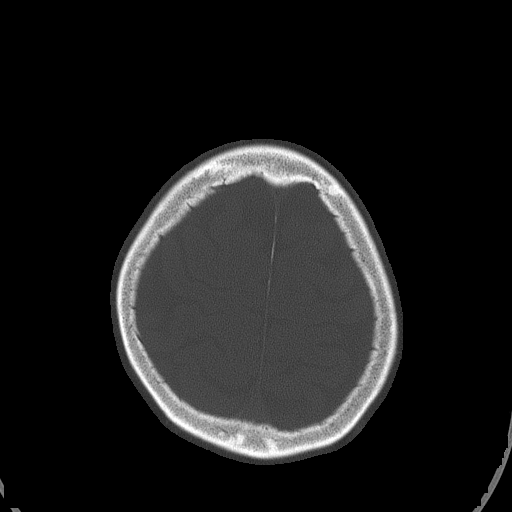
[im 59/70  brain]
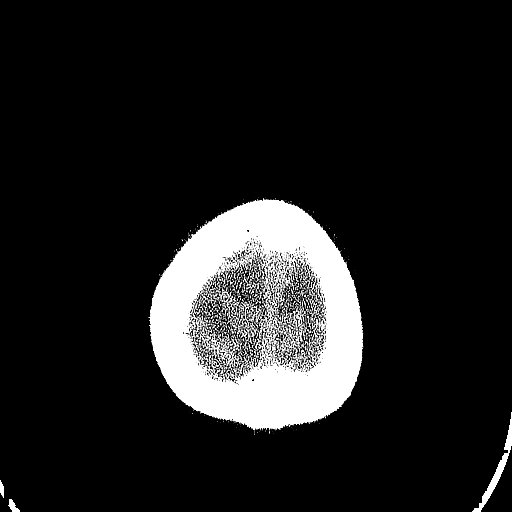
[im 63/70  brain]
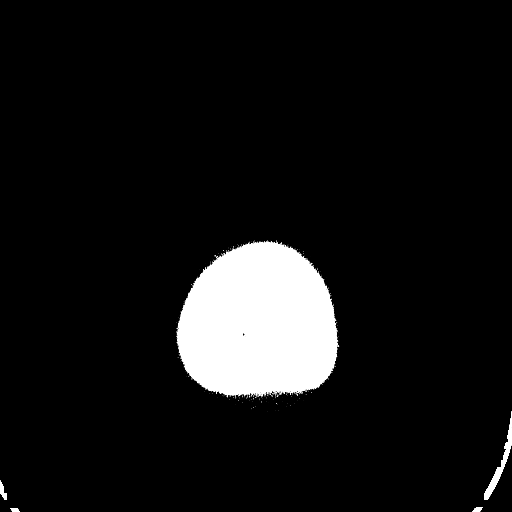
[im 66/70  brain]
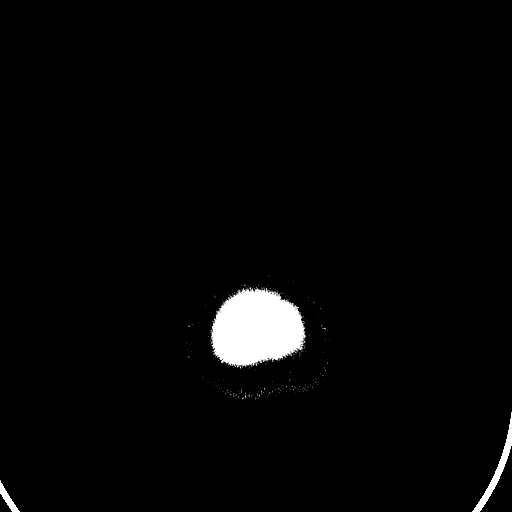

[16 of 30 positions shown; findings below may reference images not displayed]

FINDINGS: No mass lesion, mass effect, midline shift, hydrocephalus,
hemorrhage. No territorial ischemia or acute infarction. Question
RIGHT lens extraction. Atrophy is less than expected for age.

Calvarium intact. Mastoid air cells clear. Carotid atherosclerosis.
Hypoplastic sphenoid sinuses.
IMPRESSION: Negative CT head.

## 2020-07-09 HISTORY — PX: PERCUTANEOUS BALLOON VALVULOPLASTY: SHX270

## 2023-02-06 ENCOUNTER — Emergency Department (HOSPITAL_COMMUNITY): Payer: Medicare PPO

## 2023-02-06 ENCOUNTER — Inpatient Hospital Stay (HOSPITAL_COMMUNITY): Payer: Medicare PPO

## 2023-02-06 ENCOUNTER — Other Ambulatory Visit: Payer: Self-pay

## 2023-02-06 ENCOUNTER — Inpatient Hospital Stay (HOSPITAL_COMMUNITY): Payer: Medicare PPO | Admitting: Internal Medicine

## 2023-02-06 ENCOUNTER — Encounter (HOSPITAL_COMMUNITY): Payer: Self-pay | Admitting: Internal Medicine

## 2023-02-06 ENCOUNTER — Emergency Department (HOSPITAL_COMMUNITY): Payer: Medicare PPO | Admitting: Radiology

## 2023-02-06 ENCOUNTER — Observation Stay
Admission: EM | Admit: 2023-02-06 | Discharge: 2023-02-07 | Disposition: A | Discharge status: Home / Self Care | Payer: Medicare PPO | Attending: Family Medicine | Admitting: Family Medicine

## 2023-02-06 DIAGNOSIS — I509 Heart failure, unspecified: Secondary | ICD-10-CM | POA: Insufficient documentation

## 2023-02-06 DIAGNOSIS — R001 Bradycardia, unspecified: Secondary | ICD-10-CM | POA: Insufficient documentation

## 2023-02-06 DIAGNOSIS — J449 Chronic obstructive pulmonary disease, unspecified: Secondary | ICD-10-CM | POA: Diagnosis present

## 2023-02-06 DIAGNOSIS — J189 Pneumonia, unspecified organism: Secondary | ICD-10-CM | POA: Insufficient documentation

## 2023-02-06 DIAGNOSIS — Z953 Presence of xenogenic heart valve: Secondary | ICD-10-CM | POA: Insufficient documentation

## 2023-02-06 DIAGNOSIS — R0600 Dyspnea, unspecified: Principal | ICD-10-CM

## 2023-02-06 DIAGNOSIS — J440 Chronic obstructive pulmonary disease with acute lower respiratory infection: Secondary | ICD-10-CM | POA: Insufficient documentation

## 2023-02-06 DIAGNOSIS — J9621 Acute and chronic respiratory failure with hypoxia: Principal | ICD-10-CM | POA: Insufficient documentation

## 2023-02-06 DIAGNOSIS — I255 Ischemic cardiomyopathy: Secondary | ICD-10-CM | POA: Insufficient documentation

## 2023-02-06 DIAGNOSIS — E119 Type 2 diabetes mellitus without complications: Secondary | ICD-10-CM | POA: Insufficient documentation

## 2023-02-06 DIAGNOSIS — Z9889 Other specified postprocedural states: Secondary | ICD-10-CM | POA: Diagnosis present

## 2023-02-06 DIAGNOSIS — I350 Nonrheumatic aortic (valve) stenosis: Secondary | ICD-10-CM | POA: Insufficient documentation

## 2023-02-06 DIAGNOSIS — Z794 Long term (current) use of insulin: Secondary | ICD-10-CM | POA: Insufficient documentation

## 2023-02-06 HISTORY — DX: Type 2 diabetes mellitus without complications (CMS HCC): E11.9

## 2023-02-06 HISTORY — DX: Atherosclerotic heart disease of native coronary artery without angina pectoris: I25.10

## 2023-02-06 HISTORY — DX: Arthropathy, unspecified: M12.9

## 2023-02-06 HISTORY — DX: Heart failure, unspecified (CMS HCC): I50.9

## 2023-02-06 HISTORY — DX: Essential (primary) hypertension: I10

## 2023-02-06 HISTORY — DX: Presence of dental prosthetic device (complete) (partial): Z97.2

## 2023-02-06 LAB — BLOOD GAS W/ LACTATE REFLEX
%FIO2 (VENOUS): 21 %
BASE EXCESS: 9.4 mmol/L — ABNORMAL HIGH (ref 0.0–3.0)
BICARBONATE (VENOUS): 31.6 mmol/L — ABNORMAL HIGH (ref 22.0–29.0)
LACTATE: 2 mmol/L — ABNORMAL HIGH (ref <=1.9)
O2 SATURATION (VENOUS): 72.1 %
PCO2 (VENOUS): 37 mm/Hg — ABNORMAL LOW (ref 41–51)
PH (VENOUS): 7.55 — ABNORMAL HIGH (ref 7.32–7.43)
PO2 (VENOUS): 32 mm/Hg

## 2023-02-06 LAB — ECG 12 LEAD
Atrial Rate: 50 {beats}/min
Calculated P Axis: 58 degrees
Calculated R Axis: 62 degrees
Calculated T Axis: 44 degrees
PR Interval: 190 ms
QRS Duration: 102 ms
QT Interval: 500 ms
QTC Calculation: 455 ms
Ventricular rate: 50 {beats}/min

## 2023-02-06 LAB — CBC WITH DIFF
BASOPHIL #: <0.1 10*3/uL (ref <=0.20)
BASOPHIL %: 1 %
EOSINOPHIL #: 0.68 10*3/uL — ABNORMAL HIGH (ref <=0.50)
EOSINOPHIL %: 7 %
HCT: 35 % (ref 34.8–46.0)
HGB: 11.7 g/dL (ref 11.5–16.0)
IMMATURE GRANULOCYTE #: <0.1 10*3/uL (ref <0.10)
IMMATURE GRANULOCYTE %: 0 % (ref 0.0–1.0)
LYMPHOCYTE #: 1.72 10*3/uL (ref 1.00–4.80)
LYMPHOCYTE %: 19 %
MCH: 30.1 pg (ref 26.0–32.0)
MCHC: 33.4 g/dL (ref 31.0–35.5)
MCV: 90 fL (ref 78.0–100.0)
MONOCYTE #: 0.75 10*3/uL (ref 0.20–1.10)
MONOCYTE %: 8 %
MPV: 9.3 fL (ref 8.7–12.5)
NEUTROPHIL #: 6.01 10*3/uL (ref 1.50–7.70)
NEUTROPHIL %: 65 %
PLATELETS: 333 10*3/uL (ref 150–400)
RBC: 3.89 10*6/uL (ref 3.85–5.22)
RDW-CV: 19.8 % — ABNORMAL HIGH (ref 11.5–15.5)
WBC: 9.3 10*3/uL (ref 3.7–11.0)

## 2023-02-06 LAB — URINALYSIS, MICROSCOPIC

## 2023-02-06 LAB — MAGNESIUM: MAGNESIUM: 2.3 mg/dL (ref 1.8–2.6)

## 2023-02-06 LAB — URINALYSIS, MACROSCOPIC
BILIRUBIN: NEGATIVE mg/dL
BLOOD: NEGATIVE mg/dL
GLUCOSE: NEGATIVE mg/dL
KETONES: NEGATIVE mg/dL
LEUKOCYTES: NEGATIVE WBCs/uL
NITRITE: NEGATIVE
PH: 7 (ref 5.0–8.0)
PROTEIN: NEGATIVE mg/dL
SPECIFIC GRAVITY: 1.007 (ref 1.002–1.030)
UROBILINOGEN: <2 mg/dL (ref <2.0)

## 2023-02-06 LAB — COVID-19 ~~LOC~~ MOLECULAR LAB TESTING
INFLUENZA VIRUS TYPE A: NOT DETECTED
INFLUENZA VIRUS TYPE B: NOT DETECTED
RESPIRATORY SYNCTIAL VIRUS (RSV): NOT DETECTED
SARS-CoV-2: NOT DETECTED

## 2023-02-06 LAB — TROPONIN-I
TROPONIN-I HS: 13 ng/L (ref <=14.0)
TROPONIN-I HS: 12.2 ng/L (ref <=14.0)

## 2023-02-06 LAB — BASIC METABOLIC PANEL
ANION GAP: 11 mmol/L (ref 4–13)
BUN/CREA RATIO: 31 — ABNORMAL HIGH (ref 6–22)
BUN: 33 mg/dL — ABNORMAL HIGH (ref 8–25)
CALCIUM: 10.5 mg/dL — ABNORMAL HIGH (ref 8.6–10.3)
CHLORIDE: 99 mmol/L (ref 96–111)
CO2 TOTAL: 30 mmol/L (ref 23–31)
CREATININE: 1.05 mg/dL (ref 0.60–1.05)
ESTIMATED GFR - FEMALE: 49 mL/min/BSA — ABNORMAL LOW (ref >=60)
GLUCOSE: 119 mg/dL (ref 65–125)
POTASSIUM: 3.5 mmol/L (ref 3.5–5.1)
SODIUM: 140 mmol/L (ref 136–145)

## 2023-02-06 LAB — POC BLOOD GLUCOSE (RESULTS): GLUCOSE, POC: 234 mg/dl — ABNORMAL HIGH (ref 60–110)

## 2023-02-06 LAB — PROCALCITONIN: PROCALCITONIN: 0.04 ng/mL (ref <0.50)

## 2023-02-06 LAB — HGA1C (HEMOGLOBIN A1C WITH EST AVG GLUCOSE)
ESTIMATED AVERAGE GLUCOSE: 105 mg/dL
HEMOGLOBIN A1C: 5.3 % (ref 4.3–6.1)

## 2023-02-06 LAB — THYROID STIMULATING HORMONE WITH FREE T4 REFLEX: TSH: 2.55 u[IU]/mL (ref 0.350–4.940)

## 2023-02-06 LAB — BLUE TOP TUBE

## 2023-02-06 LAB — B-TYPE NATRIURETIC PEPTIDE (BNP),PLASMA: BNP: 254 pg/mL — ABNORMAL HIGH (ref <=99)

## 2023-02-06 MED ORDER — ACETAMINOPHEN 325 MG TABLET
650.0000 mg | ORAL_TABLET | ORAL | Status: DC | PRN
Start: 2023-02-06 — End: 2023-02-07

## 2023-02-06 MED ORDER — ATORVASTATIN 10 MG TABLET
10.0000 mg | ORAL_TABLET | Freq: Every evening | ORAL | Status: DC
Start: 2023-02-06 — End: 2023-02-07
  Administered 2023-02-06: 10 mg via ORAL
  Filled 2023-02-06: qty 1

## 2023-02-06 MED ORDER — DOXYCYCLINE HYCLATE 100 MG CAPSULE
100.0000 mg | ORAL_CAPSULE | ORAL | Status: AC
Start: 2023-02-06 — End: 2023-02-06
  Administered 2023-02-06: 100 mg via ORAL
  Filled 2023-02-06: qty 1

## 2023-02-06 MED ORDER — SODIUM CHLORIDE 0.9 % (FLUSH) INJECTION SYRINGE
5.0000 mL | INJECTION | Freq: Two times a day (BID) | INTRAMUSCULAR | Status: DC
Start: 2023-02-06 — End: 2023-02-07
  Administered 2023-02-06 – 2023-02-07 (×2): 5 mL

## 2023-02-06 MED ORDER — INSULIN LISPRO 100 UNIT/ML SUB-Q SSIP VIAL
0.0000 [IU] | INJECTION | Freq: Four times a day (QID) | SUBCUTANEOUS | Status: DC
Start: 2023-02-06 — End: 2023-02-07
  Administered 2023-02-06: 2 [IU] via SUBCUTANEOUS
  Administered 2023-02-07 (×2): 0 [IU] via SUBCUTANEOUS
  Filled 2023-02-06: qty 2

## 2023-02-06 MED ORDER — FUROSEMIDE 10 MG/ML INJECTION SOLUTION
20.0000 mg | Freq: Two times a day (BID) | INTRAMUSCULAR | Status: DC
Start: 2023-02-06 — End: 2023-02-06

## 2023-02-06 MED ORDER — DEXTROSE 10 % IN WATER (D10W) BOLUS
250.0000 mL | INJECTION | INTRAVENOUS | Status: DC | PRN
Start: 2023-02-06 — End: 2023-02-07

## 2023-02-06 MED ORDER — GLUCAGON 1 MG SOLUTION FOR INJECTION
1.0000 mg | Freq: Once | INTRAMUSCULAR | Status: DC | PRN
Start: 2023-02-06 — End: 2023-02-07

## 2023-02-06 MED ORDER — CELECOXIB 100 MG CAPSULE
100.0000 mg | ORAL_CAPSULE | Freq: Two times a day (BID) | ORAL | Status: DC
Start: 2023-02-06 — End: 2023-02-07
  Administered 2023-02-06 – 2023-02-07 (×2): 100 mg via ORAL
  Filled 2023-02-06 (×2): qty 1

## 2023-02-06 MED ORDER — ONDANSETRON HCL (PF) 4 MG/2 ML INJECTION SOLUTION
4.0000 mg | Freq: Four times a day (QID) | INTRAMUSCULAR | Status: DC | PRN
Start: 2023-02-06 — End: 2023-02-07

## 2023-02-06 MED ORDER — DEXTROSE 40 % ORAL GEL
15.0000 g | ORAL | Status: DC | PRN
Start: 2023-02-06 — End: 2023-02-07

## 2023-02-06 MED ORDER — SODIUM CHLORIDE 0.9 % INJECTION SOLUTION
2.0000 mL | Freq: Once | INTRAVENOUS | Status: DC | PRN
Start: 2023-02-06 — End: 2023-02-07
  Administered 2023-02-06: 2 mL via INTRAVENOUS
  Filled 2023-02-06: qty 2

## 2023-02-06 MED ORDER — METOPROLOL SUCCINATE ER 25 MG TABLET,EXTENDED RELEASE 24 HR
12.5000 mg | ORAL_TABLET | Freq: Every day | ORAL | Status: DC
Start: 2023-02-07 — End: 2023-02-07
  Administered 2023-02-07: 12.5 mg via ORAL
  Filled 2023-02-06: qty 1

## 2023-02-06 MED ORDER — SODIUM CHLORIDE 0.9 % (FLUSH) INJECTION SYRINGE
5.0000 mL | INJECTION | INTRAMUSCULAR | Status: DC | PRN
Start: 2023-02-06 — End: 2023-02-07

## 2023-02-06 MED ORDER — FERROUS SULFATE 325 MG (65 MG IRON) TABLET,DELAYED RELEASE
325.0000 mg | DELAYED_RELEASE_TABLET | Freq: Every morning | ORAL | Status: DC
Start: 2023-02-07 — End: 2023-02-07
  Administered 2023-02-07: 325 mg via ORAL
  Filled 2023-02-06: qty 1

## 2023-02-06 MED ORDER — CEFTRIAXONE 1 GRAM/50 ML IN DEXTROSE (ISO-OSMOT) INTRAVENOUS PIGGYBACK
1.0000 g | INJECTION | INTRAVENOUS | Status: AC
Start: 2023-02-06 — End: 2023-02-06
  Administered 2023-02-06: 1 g via INTRAVENOUS
  Administered 2023-02-06: 0 g via INTRAVENOUS
  Filled 2023-02-06: qty 50

## 2023-02-06 MED ORDER — FUROSEMIDE 10 MG/ML INJECTION SOLUTION
20.0000 mg | Freq: Every day | INTRAMUSCULAR | Status: DC
Start: 2023-02-07 — End: 2023-02-07
  Administered 2023-02-07: 20 mg via INTRAVENOUS
  Filled 2023-02-06: qty 2

## 2023-02-06 NOTE — ED Provider Notes (Signed)
]   [facility] - Emergency Department  ED Primary Provider Note  History of Present Illness   Chief Complaint   Patient presents with    Shortness of Breath     Arrival: The patient arrived by Ambulance  Alicia Moody is a 89 y.o. female with past medical history including hyperlipidemia, CHF and type 2 diabetes who had concerns including Shortness of Breath.  Patient presents to the emergency department via EMS for evaluation of shortness of breath which started this morning.  Symptoms were similar to her previous episodes of congestive heart failure.  Patient recently moved to the [place] from [place].  No previous ED visits on Epic.  EMS reports that patient was breathing rapidly upon their arrival, but her oxygen saturation was 95% on room air.  She was placed on 1 L of oxygen and was satting 99% upon arrival at the hospital.  No complaints of fevers, productive cough, chest pain, vomiting or swelling in the legs.  Patient denies any recent changes in her medication.    Review of Systems   Constitutional: No fever or chills, positive mild weakness   Skin: No rash or diaphoresis  HENT: No headaches or congestion  Eyes: No vision changes or photophobia   Cardio: No chest pain, palpitations or leg swelling   Respiratory: No significant cough or wheezing, positive SOB  GI:  No nausea, vomiting or stool changes  GU:  No dysuria, hematuria, or increased frequency  MSK: No muscle aches, joint or back pain  Neuro: No seizures, LOC, numbness, tingling, or focal weakness  Psychiatric: No depression, SI or substance abuse  All other systems reviewed and are negative.      History Reviewed This Encounter: Medical History  Surgical History  Family History  Social History    Physical Exam   ED Triage Vitals   BP (Non-Invasive) 02/06/23 1024 (!) 144/51   Heart Rate 02/06/23 1024 53   Respiratory Rate 02/06/23 1024 19   Temperature 02/06/23 1024 (!) 35.9 C (96.7 F)   SpO2 02/06/23 1024 97 %    Weight 02/06/23 1024 48.5 kg (106 lb 14.8 oz)   Height 02/06/23 1633 1.499 m (4\' 11" )       Constitutional:  89 y.o. female who appears in no distress. Normal color, no cyanosis.  Sitting up in bed.  HENT:   Head: Normocephalic and atraumatic.   Mouth/Throat: Oropharynx is clear with mildly dry mucous membranes.  Eyes: EOMI, PERRL   Neck: Trachea midline. Neck supple.  Cardiovascular:  Bradycardic, no murmurs, rubs or gallops. Intact distal pulses.  Pulmonary/Chest: BS equal bilaterally. No respiratory distress. No wheezes, bibasilar rales, no chest tenderness.   Abdominal: Bowel sounds present and normal. Abdomen soft, no tenderness, no rebound and no guarding.  Back: No midline spinal tenderness, no paraspinal tenderness, no CVA tenderness.           Musculoskeletal: No pedal edema, calf tenderness or deformity.  Skin: Warm and dry. No rash, erythema or cyanosis.  Mildly pale.  Psychiatric: Normal mood and affect. Behavior is normal.   Neurological: Patient keenly alert and responsive, easily able to raise eyebrows, facial muscles/expressions symmetric, speaking in fluent sentences, moving all extremities, gait not assessed at this time  Patient Data     Labs Ordered/Reviewed   BASIC METABOLIC PANEL - Abnormal; Notable for the following components:       Result Value    CALCIUM 10.5 (*)     BUN 33 (*)  BUN/CREA RATIO 31 (*)     ESTIMATED GFR - FEMALE 49 (*)     All other components within normal limits   B-TYPE NATRIURETIC PEPTIDE (BNP),PLASMA - Abnormal; Notable for the following components:    BNP 254 (*)     All other components within normal limits   BLOOD GAS W/ LACTATE REFLEX - Abnormal; Notable for the following components:    PH (VENOUS) 7.55 (*)     PCO2 (VENOUS) 37 (*)     BICARBONATE (VENOUS) 31.6 (*)     BASE EXCESS 9.4 (*)     LACTATE 2.0 (*)     All other components within normal limits   CBC WITH DIFF - Abnormal; Notable for the following components:    RDW-CV 19.8 (*)     EOSINOPHIL # 0.68 (*)      All other components within normal limits   URINALYSIS, MICROSCOPIC - Abnormal; Notable for the following components:    HYALINE CASTS 2-5 (*)     All other components within normal limits   TROPONIN-I - Normal   MAGNESIUM - Normal   URINALYSIS, MACROSCOPIC - Normal   COVID-19 [facility] MOLECULAR LAB TESTING - Normal    Narrative:     Results are for the simultaneous qualitative identification of SARS-CoV-2 (formerly 2019-nCoV), Influenza A, Influenza B, and RSV RNA. These etiologic agents are generally detectable in nasopharyngeal and nasal swabs during the ACUTE PHASE of infection. Hence, this test is intended to be performed on respiratory specimens collected from individuals with signs and symptoms of upper respiratory tract infection who meet Centers for Disease Control and Prevention (CDC) clinical and/or epidemiological criteria for Coronavirus Disease 2019 (COVID-19) testing. CDC COVID-19 criteria for testing on human specimens is available at [facility] webpage information for Healthcare Professionals: Coronavirus Disease 2019 (COVID-19) ([email or web address]).     False-negative results may occur if the virus has genomic mutations, insertions, deletions, or rearrangements or if performed very early in the course of illness. Otherwise, negative results indicate virus specific RNA targets are not detected, however negative results do not preclude SARS-CoV-2 infection/COVID-19, Influenza, or Respiratory syncytial virus infection. Results should not be used as the sole basis for patient management decisions. Negative results must be combined with clinical observations, patient history, and epidemiological information. If upper respiratory tract infection is still suspected based on exposure history together with other clinical findings, re-testing should be considered.    Disclaimer:   This assay has been authorized by FDA under an Emergency Use Authorization for use in  laboratories certified under the Clinical Laboratory Improvement Amendments of 1988 (CLIA), 42 U.S.C. [phone], to perform high complexity tests. The impacts of vaccines, antiviral therapeutics, antibiotics, chemotherapeutic or immunosuppressant drugs have not been evaluated.     Test methodology:   Cepheid Xpert Xpress SARS-CoV-2/Flu/RSV Assay real-time polymerase chain reaction (RT-PCR) test on the GeneXpert Dx and Xpert Xpress systems.   PROCALCITONIN - Normal   ADULT ROUTINE BLOOD CULTURE, SET OF 2 BOTTLES (BACTERIA AND YEAST)   ADULT ROUTINE BLOOD CULTURE, SET OF 2 BOTTLES (BACTERIA AND YEAST)   CBC/DIFF    Narrative:     The following orders were created for panel order CBC/DIFF.  Procedure                               Abnormality         Status                     ---------                               -----------         ------  CBC WITH DIFF[[id number]]                Abnormal            Final result                 Please view results for these tests on the individual orders.   EXTRA TUBES - STJ    Narrative:     The following orders were created for panel order EXTRA TUBES - STJ.  Procedure                               Abnormality         Status                     ---------                               -----------         ------                     BLUE TOP ZOXW[[id number]]                                    Final result                 Please view results for these tests on the individual orders.   BLUE TOP TUBE   URINALYSIS, MACROSCOPIC AND MICROSCOPIC W/CULTURE REFLEX    Narrative:     The following orders were created for panel order URINALYSIS, MACROSCOPIC AND MICROSCOPIC W/CULTURE REFLEX.  Procedure                               Abnormality         Status                     ---------                               -----------         ------                     URINALYSIS, MACROSCOPIC[[id number]]      Normal              Final result               URINALYSIS, MICROSCOPIC[[id number]]       Abnormal            Final result                 Please view results for these tests on the individual orders.     CT CHEST WO IV CONTRAST   Final Result by Edi, Radresults In (06/10 1349)   Possible small patchy infiltrates versus chronic scarring changes. No significant area of pneumonia or other definite abnormality seen to explain shortness of breath.                  The CT exam was performed using one or more the following a dose reduction techniques: Automated exposure control, adjustment of the mA and/or  kV according to the patient's size, or use of iterative reconstruction technique.               Radiologist location ID: [id number]         XR AP MOBILE CHEST   Final Result by Edi, Radresults In (06/10 1121)   No acute process. Fracture deformity of the proximal left humerus of uncertain acuity/chronicity.                           Radiologist location ID: [id number]           Medical Decision Making        Medical Decision Making  Amount and/or Complexity of Data Reviewed  Labs: ordered.  Radiology: ordered.  ECG/medicine tests: ordered.    Risk  Prescription drug management.  Decision regarding hospitalization.    Patient presents to the emergency department for evaluation of shortness of breath which started this morning with concern for congestive heart failure.  She denies fevers, productive cough, chest pain, swelling in the legs or rashes.  Patient does not have asthma, use oxygen or CPAP at home.  She recently moved to the area from [place].  Exam shows no visible respiratory distress at rest with a pulse ox of 97% on 2 liters/minute and normal vital signs except for a blood pressure of 144/51 and temperature of 35.9 C.  Mucous membranes were mildly dry.  Lung exam showed a few basilar rales.  Abdominal exam was benign and nontender.  No significant peripheral edema was noted.  Patient was mildly pale with no visible rashes or cellulitis on exposed skin.  EKG showed sinus bradycardia  at 50 with normal axis and nonspecific changes.  No obvious acute ischemia or heart block were noted.  Chest x-ray showed no acute process.  Patient had fracture deformity of the proximal left humerus of uncertain age.  Laboratory studies showed a normal CBC and electrolytes except for a calcium of 10.5 and BUN of 33.  Magnesium, troponin and procalcitonin were normal.  BNP was mildly elevated at 254.  Venous pH was 7.55 with a pCO2 of 37.  Lactate was elevated at 2.0.  COVID, flu and RSV were negative.  Urinalysis was normal.  CT of the chest without contrast showed possible small patchy infiltrates versus chronic scarring.  Several thoracic compression fractures were noted as well.  Patient and family were advised of all test results.  Differential diagnoses include bradycardia due to medications, viral URI, bronchitis, pneumonia, congestive heart failure, acute coronary syndrome and early sepsis among others.  Patient had persistently low heart rates in the emergency department in the mid 40's without signs of heart block.  Blood pressure was mainly in the 100-120 systolic range with diastolic readings in the 40's, but occasional drops into the 90 systolic range were noted.  Patient denied any significant productive cough.  She did have 1 pulse ox drop down to 89%.  No overt signs of heart failure noted on CT scan.  Patient was medicated with IV Rocephin and oral doxycycline to cover infection.  Patient was agreeable with hospital admission for observation.  Case was discussed with the hospitalist and Dr. Manson Passey saw her in the emergency department.  ED Course as of 02/06/23 2222   Mon Feb 06, 2023   1237 White blood cell count 9.3, hemoglobin 11.7 and platelets 333.  Electrolytes within normal limits except for calcium of 10.5 and BUN of 33.  Magnesium 2.3.  BNP elevated at 254.  Troponin 13.  Venous pH 7.55 with a pCO2 of 37.  Lactate elevated at 2.0.   1238 Chest x-ray: No acute process. Fracture deformity of  the proximal left humerus of uncertain acuity/chronicity.   1359 PROCEDURE DESCRIPTION: CT CHEST WO IV CONTRAST     CLINICAL INDICATION: shortness of breath     COMPARISON: No prior studies were compared.        FINDINGS: Aortic mesh stent graft noted. There are some mild emphysematous changes. There are a few patchy areas of ill-defined density for instance in the inferior segment of the right lower lobe series 3 images 27 and 28 area and also on the left series 3233, which could be small inflammatory areas but otherwise no significant area of consolidation or definite pneumonia. These changes could potentially be chronic in nature. No pleural effusion or suspicious pulmonary nodule identified. Heart generally not enlarged. Visualized upper abdomen shows nothing acute or suspicious. Benign partially imaged left ovarian cyst. There are several mild T6 and 7 thoracic spine superior endplate compression deformities and a much more prominent compression of T12 with two thirds of height loss. Presumably these are old but clinical correlation recommended.     IMPRESSION:  Possible small patchy infiltrates versus chronic scarring changes. No significant area of pneumonia or other definite abnormality seen to explain shortness of breath.     [id number] Advised patient of follow up test results.  Reports minimal cough.  Cultures were obtained and patient was medicated with IV Rocephin and oral doxycycline.  Patient was agreeable with hospital admission.   1438 Case discussed with Dr. Manson Passey who will see patient in the ED for admission.        Medications Administered in the ED   cefTRIAXone (ROCEPHIN) 1 g in iso-osmotic 50 mL premix IVPB (has no administration in time range)   doxycycline hyclate (VIBRAMYCIN) capsule (has no administration in time range)     Clinical Impression   Dyspnea, unspecified type (Primary)   Bradycardia   Pneumonia     Disposition: Admitted    Elio Forget, MD

## 2023-02-06 NOTE — ED Nurses Note (Signed)
Lab contacted for straight stick.

## 2023-02-06 NOTE — ED Nurses Note (Signed)
Provider at bedside

## 2023-02-06 NOTE — ED Nurses Note (Signed)
Patient resting on cot.   Cardiac and VS monitoring in progress. Denies any needs at this time, call bell within reach.

## 2023-02-06 NOTE — Nurses Notes (Signed)
Received patient to room 206.  Pt is alert and oriented. Pt oriented to room and nurse call system.  Pt ambulated from wheelchair to bed with stand by assistance.  Admission information obtained from pt's son who is MPOA.

## 2023-02-06 NOTE — ED Nurses Note (Signed)
XR at bedside

## 2023-02-06 NOTE — ED Nurses Note (Addendum)
Patient given diabetic meal tray. Son at bedside assisting patient.

## 2023-02-06 NOTE — ED Nurses Note (Signed)
Lab at bedside

## 2023-02-06 NOTE — ED Nurses Note (Signed)
Report given to Barbara RN

## 2023-02-06 NOTE — ED Nurses Note (Signed)
Patient resting on cot. Cardiac and VS monitoring in progress.   Call bell within reach.

## 2023-02-06 NOTE — ED Triage Notes (Addendum)
Patient arrives via [facility] with c/o SOB starting this morning. Fire reports patient was breathing rapidly upon arrival but oxygen saturation was 95% on RA.   Patient at 99% on [facility] upon arrival to ED.   GCS 15  Patient reports hx of CHF, diabetes.   Recently moved to [facility] from [place].

## 2023-02-06 NOTE — ED Nurses Note (Signed)
Dr. Brown at bedside

## 2023-02-06 NOTE — ED Nurses Note (Signed)
Patient resting on cot cardiac and VS monitoring in progress.   Call bell within reach.

## 2023-02-06 NOTE — Nurses Notes (Signed)
ECHO completed at this time at bedside.

## 2023-02-06 NOTE — Progress Notes (Signed)
Preliminary TTE results discussed with Dr Rueben Bash from [facility], no emergent intervention required at this time, no need for transfer at this time. Will obtain medical records from [facility].    Jacqulynn Cadet, FNP-C

## 2023-02-06 NOTE — Care Plan (Signed)
Problem: Adult Inpatient Plan of Care  Goal: Patient-Specific Goal (Individualized)  Recent Flowsheet Documentation  Taken 02/06/2023 1637 by Judeth Cornfield, RN  Individualized Care Needs: Return to baseline  Anxieties, Fears or Concerns: denies  Patient-Specific Goals (Include Timeframe): Return to baseline and go home  Goal: Absence of Hospital-Acquired Illness or Injury  Intervention: Identify and Manage Fall Risk  Recent Flowsheet Documentation  Taken 02/06/2023 1715 by Judeth Cornfield, RN  Safety Promotion/Fall Prevention:   safety round/check completed   activity supervised  Intervention: Prevent Skin Injury  Recent Flowsheet Documentation  Taken 02/06/2023 1715 by Judeth Cornfield, RN  Skin Protection: adhesive use limited  Intervention: Prevent and Manage VTE (Venous Thromboembolism) Risk  Recent Flowsheet Documentation  Taken 02/06/2023 1715 by Judeth Cornfield, RN  VTE Prevention/Management: sequential compression devices on  Taken 02/06/2023 1621 by Judeth Cornfield, RN  VTE Prevention/Management: sequential compression device initiated  Goal: Optimal Comfort and Wellbeing  Intervention: Provide Person-Centered Care  Recent Flowsheet Documentation  Taken 02/06/2023 1715 by Judeth Cornfield, RN  Trust Relationship/Rapport:   care explained   questions encouraged   thoughts/feelings acknowledged   Care Plan initiated.

## 2023-02-06 NOTE — H&P (Signed)
[facility] -- Ad[facility]    Name: Alicia Moody  Age: 89 y.o.   Gender: female  MRN: [id number]  Date of Admission:  02/06/2023  PCP: Kenney Houseman [place]  Attending: Dorann Ou, DO  Consultants:     Chief Complaint:   Chief Complaint   Patient presents with    Shortness of Breath       HPI:  This is a 89 y.o. female who presented to the ER with complaint of shortness of breath.  Patient recently moved from [place] to [facility].  She reported history of CHF, COPD on 2 L qhs at baseline, DM2.  She reported that on day of presentation, she developed shortness of breath, similar to previous admissions when she was diagnosed with congestive heart failure.  Per EMS report, patients was tachypnic on their arrival on room air, was placed on 1 L NC and brought to the ER for evaluation.  Patient denied any fevers/chills, chest pains, palpitations.  She did complain of generalized weakness.  Patient reported home blood pressures in the 90's systolic over 50-60's diastolic.  Patient's son reported that her HR at home is in the high 50's.  Her work-up in ER revealed WBC 9.3, creatinine 1.05, troponin 13, BNP 254, VBG with pH 7.55 and PCO2 37, procalcitonin 0.04, UA non-infectious, COVID/flu/RSV not detected.  Blood cultures obtained.  CT chest revealed chronic scarring changes.  EKG revealed sinus bradycardia with rate of 50.  Patient received Rocephin/doxycycline in ER.  Patient admitted to hospitalist service for further mangement and evaluation.       History:  COPD 2 L qhs, CHF, aortic surgery/stent    Social History     Substance and Sexual Activity   Drug Use Not on file     No Known Allergies  Family Medical History:    None         Review of Systems:  General - No fevers or chills.  Neuro - No acute seizure activity or mood changes.  HEENT/Neck - No acute hearing/vision changes.  No neck stiffness.  CV - No chest pains or palpitations.  Lungs - Refer to HPI.  Abd - No abdomen pain, no nausea/vomiting  or change in bowel habits.  GU - No dysuria or hematuria.  Ext - No new leg swelling.  Skin - No acute skin rashes.  MSK - No acute trauma.  Endo - No polydipsia or polyuria.  Heme/lymph - No easy bruising or bleeding.  No swollen lymph nodes.      Filed Vitals:    02/06/23 1315 02/06/23 1415 02/06/23 1445 02/06/23 1530   BP: (!) 115/47 (!) 101/37 (!) 98/44 (!) 152/67   Pulse: (!) 46 56 63 63   Resp: 18 16 16  (!) 22   Temp:       SpO2: 90% 92% 95% 94%       Physical Examination:  Today's Physical Exam:  Constitutional:  Chronically ill-appearing patient, lying in bed, in no acute cardiopulmonary distress at time of exam.  ENT:  ENMT without erythema or injection, mucous membranes moist, No oral lesions.   Neck:  no thyromegaly or lymphadenopathy and supple, symmetrical, trachea midline  Respiratory:  Slightly diminished in bases, otherwise clear at time of exam.   Cardiovascular:  Bradycardic but regular, sinus bradycardia with rate in the 50-60's at time of exam in ER  Gastrointestinal:  Soft, non-tender, Bowel sounds present  Musculoskeletal:  Head atraumatic and normocephalic  Integumentary:  Skin warm and dry and No rashes  Neurologic:  Alert and oriented x3  Psychiatric:  Normal affect, behavior, memory, thought content, judgement, and speech.  Recent Labs/Radiology:  Results for orders placed or performed during the hospital encounter of 02/06/23 (from the past 24 hour(s))   CBC/DIFF    Narrative    The following orders were created for panel order CBC/DIFF.  Procedure                               Abnormality         Status                     ---------                               -----------         ------                     CBC WITH DIFF[[id number]]                Abnormal            Final result                 Please view results for these tests on the individual orders.   BASIC METABOLIC PANEL   Result Value Ref Range    SODIUM 140 136 - 145 mmol/L    POTASSIUM 3.5 3.5 - 5.1 mmol/L    CHLORIDE 99 96 -  111 mmol/L    CO2 TOTAL 30 23 - 31 mmol/L    ANION GAP 11 4 - 13 mmol/L    CALCIUM 10.5 (H) 8.6 - 10.3 mg/dL    GLUCOSE 161 65 - 096 mg/dL    BUN 33 (H) 8 - 25 mg/dL    CREATININE 0.45 4.09 - 1.05 mg/dL    BUN/CREA RATIO 31 (H) 6 - 22    ESTIMATED GFR - FEMALE 49 (L) >=60 mL/min/BSA   TROPONIN-I   Result Value Ref Range    TROPONIN-I HS 13.0 <=14.0 ng/L ng/L   B-TYPE NATRIURETIC PEPTIDE   Result Value Ref Range    BNP 254 (H) <=99 pg/mL   MAGNESIUM   Result Value Ref Range    MAGNESIUM 2.3 1.8 - 2.6 mg/dL   BLOOD GAS W/ LACTATE REFLEX Venous   Result Value Ref Range    %FIO2 (VENOUS) 21.0 %    PH (VENOUS) 7.55 (H) 7.32 - 7.43    PCO2 (VENOUS) 37 (L) 41 - 51 mm/Hg    PO2 (VENOUS) 32 mm/Hg    BICARBONATE (VENOUS) 31.6 (H) 22.0 - 29.0 mmol/L    BASE EXCESS 9.4 (H) 0.0 - 3.0 mmol/L    O2 SATURATION (VENOUS) 72.1 %    LACTATE 2.0 (H) <=1.9 mmol/L   CBC WITH DIFF   Result Value Ref Range    WBC 9.3 3.7 - 11.0 x10^3/uL    RBC 3.89 3.85 - 5.22 x10^6/uL    HGB 11.7 11.5 - 16.0 g/dL    HCT 81.1 91.4 - 78.2 %    MCV 90.0 78.0 - 100.0 fL    MCH 30.1 26.0 - 32.0 pg    MCHC 33.4 31.0 - 35.5 g/dL    RDW-CV 95.6 (H) 21.3 - 15.5 %    PLATELETS 333 150 - 400 x10^3/uL  MPV 9.3 8.7 - 12.5 fL    NEUTROPHIL % 65.0 %    LYMPHOCYTE % 19.0 %    MONOCYTE % 8.0 %    EOSINOPHIL % 7.0 %    BASOPHIL % 1.0 %    NEUTROPHIL # 6.01 1.50 - 7.70 x10^3/uL    LYMPHOCYTE # 1.72 1.00 - 4.80 x10^3/uL    MONOCYTE # 0.75 0.20 - 1.10 x10^3/uL    EOSINOPHIL # 0.68 (H) <=0.50 x10^3/uL    BASOPHIL # <0.10 <=0.20 x10^3/uL    IMMATURE GRANULOCYTE % 0.0 0.0 - 1.0 %    IMMATURE GRANULOCYTE # <0.10 <0.10 x10^3/uL   EXTRA TUBES - STJ    Narrative    The following orders were created for panel order EXTRA TUBES - STJ.  Procedure                               Abnormality         Status                     ---------                               -----------         ------                     BLUE TOP ZOXW[[id number]]                                    Final result                  Please view results for these tests on the individual orders.   BLUE TOP TUBE   Result Value Ref Range    RAINBOW/EXTRA TUBE AUTO RESULT Yes    URINALYSIS, MACROSCOPIC AND MICROSCOPIC W/CULTURE REFLEX    Specimen: Urine, Site not specified    Narrative    The following orders were created for panel order URINALYSIS, MACROSCOPIC AND MICROSCOPIC W/CULTURE REFLEX.  Procedure                               Abnormality         Status                     ---------                               -----------         ------                     URINALYSIS, MACROSCOPIC[[id number]]      Normal              Final result               URINALYSIS, MICROSCOPIC[[id number]]      Abnormal            Final result                 Please view results for these tests on the individual orders.   URINALYSIS, MACROSCOPIC   Result Value Ref Range  COLOR Yellow Yellow, Light Yellow, Colorless    APPEARANCE Clear Clear    PH 7.0 5.0 - 8.0    LEUKOCYTES Negative Negative WBCs/uL    NITRITE Negative Negative    PROTEIN Negative Negative, 10  mg/dL    GLUCOSE Negative Negative mg/dL    KETONES Negative Negative mg/dL    UROBILINOGEN < 2.0 < 2.0, 1.0, 2.0 , 0.2  mg/dL    BILIRUBIN Negative Negative mg/dL    BLOOD Negative Negative mg/dL    SPECIFIC GRAVITY 1.610 1.002 - 1.030   URINALYSIS, MICROSCOPIC   Result Value Ref Range    RBCS None 0-2, None /hpf    WBCS 0-2 0-2, None /hpf    BACTERIA None None /hpf    SQUAMOUS EPITHELIAL None 0-2, None /hpf    MUCOUS Slight None, Slight /hpf    HYALINE CASTS 2-5 (A) None, 0-2 /lpf   COVID - 19 SCREENING - Symptomatic - PUI   Result Value Ref Range    SARS-CoV-2 Not Detected Not Detected    INFLUENZA VIRUS TYPE A Not Detected Not Detected    INFLUENZA VIRUS TYPE B Not Detected Not Detected    RESPIRATORY SYNCTIAL VIRUS (RSV) Not Detected Not Detected    Narrative    Results are for the simultaneous qualitative identification of SARS-CoV-2 (formerly 2019-nCoV), Influenza A, Influenza B, and RSV RNA.  These etiologic agents are generally detectable in nasopharyngeal and nasal swabs during the ACUTE PHASE of infection. Hence, this test is intended to be performed on respiratory specimens collected from individuals with signs and symptoms of upper respiratory tract infection who meet Centers for Disease Control and Prevention (CDC) clinical and/or epidemiological criteria for Coronavirus Disease 2019 (COVID-19) testing. CDC COVID-19 criteria for testing on human specimens is available at [facility] webpage information for Healthcare Professionals: Coronavirus Disease 2019 (COVID-19) ([email or web address]).     False-negative results may occur if the virus has genomic mutations, insertions, deletions, or rearrangements or if performed very early in the course of illness. Otherwise, negative results indicate virus specific RNA targets are not detected, however negative results do not preclude SARS-CoV-2 infection/COVID-19, Influenza, or Respiratory syncytial virus infection. Results should not be used as the sole basis for patient management decisions. Negative results must be combined with clinical observations, patient history, and epidemiological information. If upper respiratory tract infection is still suspected based on exposure history together with other clinical findings, re-testing should be considered.    Disclaimer:   This assay has been authorized by FDA under an Emergency Use Authorization for use in laboratories certified under the Clinical Laboratory Improvement Amendments of 1988 (CLIA), 42 U.S.C. 7321824067, to perform high complexity tests. The impacts of vaccines, antiviral therapeutics, antibiotics, chemotherapeutic or immunosuppressant drugs have not been evaluated.     Test methodology:   Cepheid Xpert Xpress SARS-CoV-2/Flu/RSV Assay real-time polymerase chain reaction (RT-PCR) test on the GeneXpert Dx and Xpert Xpress systems.   PROCALCITONIN   Result Value Ref Range     PROCALCITONIN 0.04 <0.50 ng/mL     CT CHEST WO IV CONTRAST   Final Result   Possible small patchy infiltrates versus chronic scarring changes. No significant area of pneumonia or other definite abnormality seen to explain shortness of breath.                  The CT exam was performed using one or more the following a dose reduction techniques: Automated exposure control, adjustment of the mA and/or kV according to the patient's size,  or use of iterative reconstruction technique.               Radiologist location ID: [id number]         XR AP MOBILE CHEST   Final Result   No acute process. Fracture deformity of the proximal left humerus of uncertain acuity/chronicity.                           Radiologist location ID: [id number]             Assessment and Plan:  Acute on chronic respiratory failure with hypoxia (CMS HCC)  Patient Active Problem List   Diagnosis    Acute on chronic respiratory failure with hypoxia (CMS HCC)    Acute on chronic congestive heart failure (CMS HCC)    Bradycardia     Acute on chronic respiratory failure with hypoxia/COPD - Patient on home 2 L qhs, placed on supplemental oxygen in ER.  Will diurese and attempt to wean to home 2 L qhs only.  Procal and WBC normal range, afebrile, hold on further abx.  IS.    Acute on chronic CHF - Will update echo. Will continue with IV Lasix and monitor response closely.  Weights and I+Os.  Discussed with son, patient does take PRN metolazone when increased edema, she did take her medications this morning already, start tomorrow.    Bradycardia - Will decrease home dose of Toprol XL to 12.5 mg at this time and monitor on telemetry.    DM2 - Finger sticks, SSI and diabetic diet.  Check A1c.    SCDs.  Home meds.  PT/OT evaluation.    Dorann Ou, DO

## 2023-02-07 ENCOUNTER — Encounter (HOSPITAL_COMMUNITY): Payer: Self-pay | Admitting: Internal Medicine

## 2023-02-07 DIAGNOSIS — Z953 Presence of xenogenic heart valve: Secondary | ICD-10-CM | POA: Diagnosis present

## 2023-02-07 DIAGNOSIS — Z9889 Other specified postprocedural states: Secondary | ICD-10-CM | POA: Diagnosis present

## 2023-02-07 DIAGNOSIS — I255 Ischemic cardiomyopathy: Secondary | ICD-10-CM | POA: Diagnosis not present

## 2023-02-07 DIAGNOSIS — I350 Nonrheumatic aortic (valve) stenosis: Secondary | ICD-10-CM | POA: Diagnosis present

## 2023-02-07 LAB — TRANSTHORACIC ECHOCARDIOGRAM - ADULT
AV LVOT peak gradient: 3 mmHg
AV mean gradient: 2 mmHg
Ao VTI: 25.4 cm
Ao peak vel: 104 cm/s
Aortic Valve Area by Continuity of Peak Velocity: 2.18 cm2
Aortic Valve Area by Continuity of VTI: 2.07 cm2
E wave decelartion time: 600 ms
E/A ratio: 0.9
E/E' ratio: 21.4
Inferior Vena Cava Diameter: 1.45 cm
Inferior Vena Cava Diameter: 1.45 cm
LA Volume Index: 51.4 ml/m2
LA size: 3.3 cm
LA size: 3.3 cm
LA volume: 63.5 cm3
LVOT diameter: 1.8 cm
LVOT stroke volume: 53 cm3
Left Ventricular Outflow Tract Peak Velocity: 89.4 cm/s
MV Comp VTI: 92.8 cm
MV Peak A Vel: 217 cm/s
MV Peak E Vel: 191 cm/s
MV mean gradient: 10 mmHg
MV stenosis pressure 1/2 time: 176 ms
Mitral Regurgitant Velocity Time Integral: 173 cm
Mitral Valve Max Velocity: 242 cm/s
Mr max vel: 521 cm/s
PV peak gradient: 1 mmHg
Pulmonic Valve Max Velocity: 53.1 cm/s
RVDD: 3.32 cm
RVDD: 3.32 cm
TDI: 8.92 cm/s
TDI: 8.92 cm/s

## 2023-02-07 LAB — CBC WITH DIFF
BASOPHIL #: <0.1 10*3/uL (ref <=0.20)
BASOPHIL %: 1 %
EOSINOPHIL #: 0.92 10*3/uL — ABNORMAL HIGH (ref <=0.50)
EOSINOPHIL %: 11 %
HCT: 32.8 % — ABNORMAL LOW (ref 34.8–46.0)
HGB: 10.8 g/dL — ABNORMAL LOW (ref 11.5–16.0)
IMMATURE GRANULOCYTE #: <0.1 10*3/uL (ref <0.10)
IMMATURE GRANULOCYTE %: 0 % (ref 0.0–1.0)
LYMPHOCYTE #: 1.76 10*3/uL (ref 1.00–4.80)
LYMPHOCYTE %: 21 %
MCH: 30.1 pg (ref 26.0–32.0)
MCHC: 32.9 g/dL (ref 31.0–35.5)
MCV: 91.4 fL (ref 78.0–100.0)
MONOCYTE #: 0.8 10*3/uL (ref 0.20–1.10)
MONOCYTE %: 10 %
MPV: 9.4 fL (ref 8.7–12.5)
NEUTROPHIL #: 4.71 10*3/uL (ref 1.50–7.70)
NEUTROPHIL %: 57 %
PLATELETS: 289 10*3/uL (ref 150–400)
RBC: 3.59 10*6/uL — ABNORMAL LOW (ref 3.85–5.22)
RDW-CV: 19.8 % — ABNORMAL HIGH (ref 11.5–15.5)
WBC: 8.3 10*3/uL (ref 3.7–11.0)

## 2023-02-07 LAB — BASIC METABOLIC PANEL
ANION GAP: 11 mmol/L (ref 4–13)
BUN/CREA RATIO: 32 — ABNORMAL HIGH (ref 6–22)
BUN: 31 mg/dL — ABNORMAL HIGH (ref 8–25)
CALCIUM: 9.1 mg/dL (ref 8.6–10.3)
CHLORIDE: 104 mmol/L (ref 96–111)
CO2 TOTAL: 27 mmol/L (ref 23–31)
CREATININE: 0.97 mg/dL (ref 0.60–1.05)
ESTIMATED GFR - FEMALE: 54 mL/min/BSA — ABNORMAL LOW (ref >=60)
GLUCOSE: 89 mg/dL (ref 65–125)
POTASSIUM: 3.4 mmol/L — ABNORMAL LOW (ref 3.5–5.1)
SODIUM: 142 mmol/L (ref 136–145)

## 2023-02-07 LAB — POC BLOOD GLUCOSE (RESULTS)
GLUCOSE, POC: 92 mg/dl (ref 60–110)
GLUCOSE, POC: 114 mg/dl — ABNORMAL HIGH (ref 60–110)

## 2023-02-07 MED ORDER — METOPROLOL SUCCINATE ER 25 MG TABLET,EXTENDED RELEASE 24 HR
12.5000 mg | ORAL_TABLET | Freq: Every day | ORAL | 0 refills | Status: AC
Start: 2023-02-08 — End: ?

## 2023-02-07 MED ORDER — POTASSIUM CHLORIDE ER 10 MEQ CAPSULE,EXTENDED RELEASE
40.0000 meq | ORAL_CAPSULE | Freq: Once | ORAL | Status: AC
Start: 2023-02-07 — End: 2023-02-07
  Administered 2023-02-07: 40 meq via ORAL
  Filled 2023-02-07: qty 4

## 2023-02-07 NOTE — Progress Notes (Signed)
Medical records from [facility] obtained from last hospitalization from 01/18/2023 to 01/20/23.  Medical and surgical history updated in EPIC.  Per notes, most recent echocardiogram in March 2024 showed LVEF of 50-55% with normally functioning aortic valve bioprosthesis, mild-to-moderate mitral regurgitation, and moderate to severe stenosis with mean mitral valve gradient of 7.9 mm Hg.  Home med list updated.    Jacqulynn Cadet, FNP-C

## 2023-02-07 NOTE — Care Plan (Signed)
Patient OOB with stand by assistance and use of walker. Blood glucose monitored per orders and SSI administered. Medications administered per MAR. Vitals obtained per orders. Medical history records received from [facility]. Medication list updated from discharge medication list from [facility]. Telemetry in place and monitored by CCU staff. Patient remains alert and oriented and on room air.   Problem: Adult Inpatient Plan of Care  Goal: Plan of Care Review  Outcome: Ongoing (see interventions/notes)  Goal: Patient-Specific Goal (Individualized)  Outcome: Ongoing (see interventions/notes)  Goal: Absence of Hospital-Acquired Illness or Injury  Outcome: Ongoing (see interventions/notes)  Intervention: Identify and Manage Fall Risk  Recent Flowsheet Documentation  Taken 02/06/2023 1930 by Keturah Shavers, RN  Safety Promotion/Fall Prevention:   activity supervised   nonskid shoes/slippers when out of bed   safety round/check completed   motion sensor pad activated  Intervention: Prevent Skin Injury  Recent Flowsheet Documentation  Taken 02/06/2023 1930 by Keturah Shavers, RN  Body Position: supine, head elevated  Skin Protection:   adhesive use limited   antimicrobial wipes  Intervention: Prevent and Manage VTE (Venous Thromboembolism) Risk  Recent Flowsheet Documentation  Taken 02/06/2023 1930 by Keturah Shavers, RN  VTE Prevention/Management: sequential compression devices on  Goal: Optimal Comfort and Wellbeing  Outcome: Ongoing (see interventions/notes)  Intervention: Provide Person-Centered Care  Recent Flowsheet Documentation  Taken 02/06/2023 1930 by Keturah Shavers, RN  Trust Relationship/Rapport:   care explained   choices provided  Goal: Rounds/Family Conference  Outcome: Ongoing (see interventions/notes)     Problem: Health Knowledge, Opportunity to Enhance (Adult,Obstetrics,Pediatric)  Goal: Knowledgeable about Health Subject/Topic  Description: Patient will demonstrate the desired outcomes by  discharge/transition of care.  Outcome: Ongoing (see interventions/notes)     Problem: Skin Injury Risk Increased  Goal: Skin Health and Integrity  Outcome: Ongoing (see interventions/notes)  Intervention: Optimize Skin Protection  Recent Flowsheet Documentation  Taken 02/06/2023 1930 by Keturah Shavers, RN  Pressure Reduction Techniques: Patient turned q 2 hours  Pressure Reduction Devices: Repositioning wedges/pillows utilized  Skin Protection:   adhesive use limited   antimicrobial wipes  Activity Management: ROM, active encouraged  Head of Bed (HOB) Positioning: HOB elevated     Problem: Fall Injury Risk  Goal: Absence of Fall and Fall-Related Injury  Outcome: Ongoing (see interventions/notes)  Intervention: Promote Injury-Free Environment  Recent Flowsheet Documentation  Taken 02/06/2023 1930 by Keturah Shavers, RN  Safety Promotion/Fall Prevention:   activity supervised   nonskid shoes/slippers when out of bed   safety round/check completed   motion sensor pad activated     Problem: Gas Exchange Impaired  Goal: Optimal Gas Exchange  Outcome: Ongoing (see interventions/notes)  Intervention: Optimize Oxygenation and Ventilation  Recent Flowsheet Documentation  Taken 02/06/2023 1930 by Keturah Shavers, RN  Head of Bed [facility]) Positioning: HOB elevated

## 2023-02-07 NOTE — Care Plan (Signed)
Problem: Adult Inpatient Plan of Care  Goal: Rounds/Family Conference  Outcome: Ongoing (see interventions/notes)  Flowsheets (Taken 02/07/2023 0902)  Participants:   occupational therapy   nursing   physician   physical therapy   pharmacy   speech language pathology   social work/services   respiratory therapy   dietitian/nutrition services  Note: 2 Gram sodium restriction; ECHO; FS; IS; Tele; DW. PT and OT consults. Discussed Antibiotic, VTE appropriateness, and evaluated completion of home medications.

## 2023-02-07 NOTE — Ancillary Notes (Signed)
Discharge planning. Pt is staying with her son and daughter in law currently, but hopes eventually to return home alone [place].  Pt has no reported problems with transportation, obtaining medications, food or housing insecurity, mental health or substance abuse.  Pt denies DME needs. Pt states that she has a rollator and oxygen at 2L for nighttime use from [facility] in [place], [place].  Pt declines home health or rehab referrals.  Pt anticipates no d/c needs.

## 2023-02-07 NOTE — Nurses Notes (Signed)
Reviewed AVS with patient and family. Reviewed and given prescriptions and medication handouts for Metoprolol.  Patient and family verbalized understanding of all discharge teaching. PIV removed with distal tip intake. Patient tolerated well. Encouraged to ask questions throughout education. Pt will be discharged to son's home via son/daughter in law with all belongings once patients son arrives.

## 2023-02-07 NOTE — Discharge Summary (Signed)
[facility]  DISCHARGE SUMMARY    PATIENT NAME:  Alicia Moody, Alicia Moody  MRN:  [id number]  DOB:  1925-12-01    ENCOUNTER DATE:  02/06/2023  INPATIENT ADMISSION DATE: 02/06/2023  DISCHARGE DATE:  02/07/2023    ATTENDING PHYSICIAN: Claria Dice, DO  SERVICE: [facility]  PRIMARY CARE PHYSICIAN: Reyne Dumas       Lay Caregiver Name: Alexsandra Zettle Caregiver Contact Number: [phone]   Lay Caregiver Relationship to patient: child    PRIMARY DISCHARGE DIAGNOSIS: Acute on chronic respiratory failure with hypoxia (CMS [facility])  Active Hospital Problems    Diagnosis Date Noted    Principal Problem: Acute on chronic respiratory failure with hypoxia (CMS HCC) [J96.21] 02/06/2023    Aortic stenosis [I35.0] 02/07/2023    S/p TAVR (transcatheter aortic valve replacement), 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     There are no active non-hospital problems to display for this patient.          Current Discharge Medication List        CONTINUE these medications - NO CHANGES were made during your visit.        Details   ascorbic acid (vitamin C) 500 mg Tablet  Commonly known as: VITAMIN C   500 mg, Oral, DAILY  Refills: 0     aspirin 81 mg Tablet, Delayed Release (E.C.)  Commonly known as: ECOTRIN   81 mg, Oral, DAILY  Refills: 0     atorvastatin 10 mg Tablet  Commonly known as: LIPITOR   10 mg, Oral, EVERY EVENING  Refills: 0     CALCIUM WITH VITAMIN D3 ORAL   1 Tablet, Oral, DAILY, Calcium and Vitamin D combination 600 mg- 200 intl units   Refills: 0     cholecalciferol (vitamin D3) 25 mcg (1,000 unit)  Tablet   1,000 Units, Oral, DAILY  Refills: 0     ferrous sulfate 324 mg (65 mg iron) Tablet, Delayed Release (E.C.)  Commonly known as: FERATAB   324 mg, Oral  Refills: 0     Fish OiL 1,000 mg (120 mg-180 mg) Capsule  Generic drug: omega-3-DHA-EPA-fish oil   1,000 mg, Oral, DAILY  Refills: 0     furosemide 20 mg Tablet  Commonly known as: LASIX   20 mg, Oral, DAILY  Refills: 0     insulin glargine 100 unit/mL injection (vial)   10 Units, Subcutaneous, NIGHTLY  Refills: 0     metOLazone 2.5 mg Tablet  Commonly known as: ZAROXOLYN   2.5 mg, Oral, DAILY  Refills: 0     metoprolol succinate 25 mg Tablet Sustained Release 24 hr  Commonly known as: TOPROL-XL   25 mg, Oral, DAILY  Refills: 0     multivitamin Tablet   1 Tablet, Oral, DAILY  Refills: 0     nitroGLYCERIN 0.4 mg Tablet, Sublingual  Commonly known as: NITROSTAT   0.4 mg, Sublingual, EVERY 5 MIN PRN, for 3 doses over 15 minutes  Refills: 0     repaglinide 0.5 mg Tablet  Commonly known as: PRANDIN  0.5 mg, Oral, 3 TIMES DAILY BEFORE MEALS  Refills: 0     vitamin B complex Tablet   1 Tablet, Oral, DAILY  Refills: 0            STOP taking these medications.      celecoxib 100 mg Capsule  Commonly known as: CeleBREX     torsemide 20 mg Tablet  Commonly known as: DEMADEX            Discharge med list refreshed?  YES     No Known Allergies  HOSPITAL PROCEDURE(S):   No orders of the defined types were placed in this encounter.      REASON FOR HOSPITALIZATION AND HOSPITAL COURSE   BRIEF HPI:     This is a 89 y.o. female who presented to the ER with complaint of shortness of breath.  Patient recently moved from [place] to [facility].  She reported history of CHF, COPD on 2 L qhs at baseline, DM2.  She reported that on day of presentation, she developed shortness of breath, similar to previous admissions when she was diagnosed with congestive heart failure.  Per EMS report, patients was tachypnic on their arrival on room air, was placed on 1 L NC and brought to the ER for  evaluation.  Patient denied any fevers/chills, chest pains, palpitations.  She did complain of generalized weakness.  Patient reported home blood pressures in the 90's systolic over 50-60's diastolic.  Patient's son reported that her HR at home is in the high 50's.  Her work-up in ER revealed WBC 9.3, creatinine 1.05, troponin 13, BNP 254, VBG with pH 7.55 and PCO2 37, procalcitonin 0.04, UA non-infectious, COVID/flu/RSV not detected.  Blood cultures obtained.  CT chest revealed chronic scarring changes.  EKG revealed sinus bradycardia with rate of 50.  Patient received Rocephin/doxycycline in ER.  Patient admitted to hospitalist service for further mangement and evaluation.       BRIEF HOSPITAL NARRATIVE:       Patient was placed on Lasix 20 mg IV daily and diuresed well.  She was weaned to and remained stable on room air.  Vitals remained stable.  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  [facility] Cardiology was consulted and no acute intervention recommended.  Electrolytes replaced p.r.n.Marland Kitchen  She returned to baseline and requested discharge on 06/11.  Hemodynamically stable at time of DC.    Physical Exam  Constitutional:       General: not in acute distress.     Appearance: Normal appearance.   HENT:      Head: Normocephalic and atraumatic.      Nose: Nose normal.      Mouth/Throat:      Mouth: Mucous membranes are moist.      Pharynx: Oropharynx is clear.   Eyes:      Extraocular Movements: Extraocular movements intact.      Conjunctiva/sclera: Conjunctivae normal.   Neck:      Musculoskeletal: Normal range of motion and neck supple.   Cardiovascular:      Rate and Rhythm: Normal rate and regular rhythm.   Pulmonary:      Effort: Pulmonary effort is normal.      Breath sounds: Normal breath sounds.   Abdominal:      General: Bowel sounds are normal.      Palpations: Abdomen is soft.   Musculoskeletal: Normal range of motion.   Skin:     General: Skin is warm and dry.  Neurological:       General: No focal deficit present.      Mental Status: alert and oriented to person, place, and time. Mental status is at baseline.   Psychiatric:         Mood and Affect: Mood normal.         Behavior: Behavior normal.         Thought Content: Thought content normal.         Judgment: Judgment normal.     TRANSITION/POST DISCHARGE CARE/PENDING TESTS/REFERRALS:   Follow-up with PCP  Follow-up with cardiology    CONDITION ON DISCHARGE:  A. Ambulation: Full ambulation  B. Self-care Ability: Complete  C. Cognitive Status Alert and Oriented x 3  D. Code status at discharge:       LINES/DRAINS/WOUNDS AT DISCHARGE:   Patient Lines/Drains/Airways Status       Active Line / Dialysis Catheter / Dialysis Graft / Drain / Airway / Wound       Name Placement date Placement time Site Days    Peripheral IV Right Median Cubital  (antecubital fossa) 02/06/23  1116  -- 1                    DISCHARGE DISPOSITION:  Home discharge  DISCHARGE INSTRUCTIONS:       DISCHARGE INSTRUCTION - DIABETIC DIET     Diet: DIABETIC DIET    Diet: LIMIT SALT/SODIUM           Claria Dice, DO    Copies sent to Care Team         Relationship Specialty Notifications Start End    [place], Kenney Houseman PCP - General   02/06/23     Phone: [phone] Fax: [phone]         [address] [place] [address]            Referring providers can utilize [email or web address] to access their referred [facility] Medicine patient's information.

## 2023-02-07 NOTE — Ancillary Notes (Signed)
Patient requested chaplain visit. She asked to received Eucharist from a Catholic priest. Fr Ed notified.    Patient in overall good spirits, has no spiritual concern, distress, or crisis apparent. Has active faith and family resources.

## 2023-02-07 NOTE — Nurses Notes (Signed)
Dr. M Jackson in to see patient.

## 2023-02-11 LAB — ADULT ROUTINE BLOOD CULTURE, SET OF 2 BOTTLES (BACTERIA AND YEAST)
BLOOD CULTURE, ROUTINE: NO GROWTH
BLOOD CULTURE, ROUTINE: NO GROWTH

## 2023-05-10 ENCOUNTER — Other Ambulatory Visit
Admission: RE | Admit: 2023-05-10 | Discharge: 2023-05-10 | Disposition: A | Discharge status: Home / Self Care | Payer: Medicare PPO | Source: Ambulatory Visit | Attending: Medical | Admitting: Medical

## 2023-05-10 ENCOUNTER — Other Ambulatory Visit (HOSPITAL_COMMUNITY): Payer: Self-pay | Admitting: Medical

## 2023-05-10 DIAGNOSIS — R300 Dysuria: Secondary | ICD-10-CM

## 2023-05-11 ENCOUNTER — Other Ambulatory Visit (HOSPITAL_COMMUNITY): Payer: Self-pay | Admitting: Medical

## 2023-05-11 ENCOUNTER — Ambulatory Visit
Admission: RE | Admit: 2023-05-11 | Discharge: 2023-05-11 | Disposition: A | Discharge status: Home / Self Care | Payer: Medicare PPO | Source: Ambulatory Visit | Attending: Medical | Admitting: Medical

## 2023-05-11 DIAGNOSIS — R410 Disorientation, unspecified: Secondary | ICD-10-CM

## 2023-05-11 LAB — LIPID PANEL
CHOL/HDL RATIO: 2.6
CHOLESTEROL: 105 mg/dL (ref 100–200)
HDL CHOL: 41 mg/dL — ABNORMAL LOW (ref >=50)
LDL CALC: 49 mg/dL (ref <100)
NON-HDL: 64 mg/dL (ref <=190)
TRIGLYCERIDES: 69 mg/dL (ref <150)
VLDL CALC: 10 mg/dL (ref <30)

## 2023-05-11 LAB — CBC WITH DIFF
BASOPHIL #: <0.1 10*3/uL (ref <=0.20)
BASOPHIL %: 1 %
EOSINOPHIL #: 0.81 10*3/uL — ABNORMAL HIGH (ref <=0.50)
EOSINOPHIL %: 8 %
HCT: 34.3 % — ABNORMAL LOW (ref 34.8–46.0)
HGB: 10.7 g/dL — ABNORMAL LOW (ref 11.5–16.0)
IMMATURE GRANULOCYTE #: <0.1 10*3/uL (ref <0.10)
IMMATURE GRANULOCYTE %: 1 % (ref 0.0–1.0)
LYMPHOCYTE #: 1.28 10*3/uL (ref 1.00–4.80)
LYMPHOCYTE %: 12 %
MCH: 29.5 pg (ref 26.0–32.0)
MCHC: 31.2 g/dL (ref 31.0–35.5)
MCV: 94.5 fL (ref 78.0–100.0)
MONOCYTE #: 0.71 10*3/uL (ref 0.20–1.10)
MONOCYTE %: 7 %
MPV: 10.2 fL (ref 8.7–12.5)
NEUTROPHIL #: 7.74 10*3/uL — ABNORMAL HIGH (ref 1.50–7.70)
NEUTROPHIL %: 71 %
PLATELETS: 295 10*3/uL (ref 150–400)
RBC: 3.63 10*6/uL — ABNORMAL LOW (ref 3.85–5.22)
RDW-CV: 20 % — ABNORMAL HIGH (ref 11.5–15.5)
WBC: 10.7 10*3/uL (ref 3.7–11.0)

## 2023-05-11 LAB — COMPREHENSIVE METABOLIC PANEL, NON-FASTING
ALBUMIN: 3.7 g/dL (ref 3.4–4.8)
ALKALINE PHOSPHATASE: 59 U/L (ref 55–145)
ALT (SGPT): 12 U/L (ref 8–22)
ANION GAP: 12 mmol/L (ref 4–13)
AST (SGOT): 23 U/L (ref 8–45)
BILIRUBIN TOTAL: 0.8 mg/dL (ref 0.3–1.3)
BUN/CREA RATIO: 24 — ABNORMAL HIGH (ref 6–22)
BUN: 18 mg/dL (ref 8–25)
CALCIUM: 10.2 mg/dL (ref 8.6–10.3)
CHLORIDE: 107 mmol/L (ref 96–111)
CO2 TOTAL: 22 mmol/L — ABNORMAL LOW (ref 23–31)
CREATININE: 0.76 mg/dL (ref 0.60–1.05)
ESTIMATED GFR - FEMALE: 73 mL/min/BSA (ref >=60)
GLUCOSE: 66 mg/dL (ref 65–125)
POTASSIUM: 4.3 mmol/L (ref 3.5–5.1)
PROTEIN TOTAL: 7 g/dL (ref 6.0–8.0)
SODIUM: 141 mmol/L (ref 136–145)

## 2023-05-11 LAB — CBC/DIFF - CLIENT CONSOLIDATED
BASOPHIL #: <0.1 10*3/uL (ref <=0.20)
BASOPHIL %: 1 %
EOSINOPHIL #: 0.81 10*3/uL — ABNORMAL HIGH (ref <=0.50)
EOSINOPHIL %: 8 %
HCT: 34.3 % — ABNORMAL LOW (ref 34.8–46.0)
HGB: 10.7 g/dL — ABNORMAL LOW (ref 11.5–16.0)
IMMATURE GRANULOCYTE #: <0.1 10*3/uL (ref <0.10)
IMMATURE GRANULOCYTE %: 1 % (ref 0.0–1.0)
LYMPHOCYTE #: 1.28 10*3/uL (ref 1.00–4.80)
LYMPHOCYTE %: 12 %
MCH: 29.5 pg (ref 26.0–32.0)
MCHC: 31.2 g/dL (ref 31.0–35.5)
MCV: 94.5 fL (ref 78.0–100.0)
MONOCYTE #: 0.71 10*3/uL (ref 0.20–1.10)
MONOCYTE %: 7 %
MPV: 10.2 fL (ref 8.7–12.5)
NEUTROPHIL #: 7.74 10*3/uL — ABNORMAL HIGH (ref 1.50–7.70)
NEUTROPHIL %: 71 %
PLATELETS: 295 10*3/uL (ref 150–400)
RBC: 3.63 10*6/uL — ABNORMAL LOW (ref 3.85–5.22)
RDW-CV: 20 % — ABNORMAL HIGH (ref 11.5–15.5)
WBC: 10.7 10*3/uL (ref 3.7–11.0)

## 2023-05-11 LAB — HGA1C (HEMOGLOBIN A1C WITH EST AVG GLUCOSE)
ESTIMATED AVERAGE GLUCOSE: 105 mg/dL
HEMOGLOBIN A1C: 5.3 % (ref 4.3–6.1)

## 2023-05-11 LAB — B-TYPE NATRIURETIC PEPTIDE (BNP),PLASMA: BNP: 811 pg/mL — ABNORMAL HIGH (ref <=99)

## 2023-05-13 LAB — URINE CULTURE,ROUTINE: URINE CULTURE: NO GROWTH

## 2023-05-31 ENCOUNTER — Ambulatory Visit (INDEPENDENT_AMBULATORY_CARE_PROVIDER_SITE_OTHER): Payer: Self-pay | Admitting: INTERNAL MEDICINE - CARDIOVASCULAR DISEASE

## 2023-06-06 ENCOUNTER — Emergency Department (HOSPITAL_COMMUNITY): Payer: Medicare PPO

## 2023-06-06 ENCOUNTER — Emergency Department
Admission: EM | Admit: 2023-06-06 | Discharge: 2023-06-06 | Disposition: A | Discharge status: Home / Self Care | Payer: Medicare PPO | Attending: Physician Assistant | Admitting: Physician Assistant

## 2023-06-06 ENCOUNTER — Encounter (HOSPITAL_COMMUNITY): Payer: Self-pay

## 2023-06-06 ENCOUNTER — Other Ambulatory Visit: Payer: Self-pay

## 2023-06-06 DIAGNOSIS — M4856XA Collapsed vertebra, not elsewhere classified, lumbar region, initial encounter for fracture: Secondary | ICD-10-CM | POA: Insufficient documentation

## 2023-06-06 DIAGNOSIS — M4854XA Collapsed vertebra, not elsewhere classified, thoracic region, initial encounter for fracture: Secondary | ICD-10-CM | POA: Insufficient documentation

## 2023-06-06 DIAGNOSIS — M545 Low back pain, unspecified: Secondary | ICD-10-CM

## 2023-06-06 DIAGNOSIS — S32000S Wedge compression fracture of unspecified lumbar vertebra, sequela: Secondary | ICD-10-CM

## 2023-06-06 DIAGNOSIS — S22000A Wedge compression fracture of unspecified thoracic vertebra, initial encounter for closed fracture: Secondary | ICD-10-CM

## 2023-06-06 LAB — URINALYSIS, MACROSCOPIC
BILIRUBIN: NEGATIVE mg/dL
BLOOD: NEGATIVE mg/dL
GLUCOSE: NEGATIVE mg/dL
KETONES: NEGATIVE mg/dL
LEUKOCYTES: NEGATIVE WBCs/uL
NITRITE: NEGATIVE
PH: 5.5 (ref 5.0–8.0)
PROTEIN: NEGATIVE mg/dL
SPECIFIC GRAVITY: 1.009 (ref 1.002–1.030)
UROBILINOGEN: <2 mg/dL (ref <2.0)

## 2023-06-06 LAB — URINALYSIS, MICROSCOPIC

## 2023-06-06 MED ORDER — LIDOCAINE 4 % TOPICAL PATCH
1.0000 | MEDICATED_PATCH | CUTANEOUS | Status: DC
Start: 2023-06-06 — End: 2023-06-06
  Administered 2023-06-06: 1 via TRANSDERMAL
  Filled 2023-06-06: qty 1

## 2023-06-06 MED ORDER — CYCLOBENZAPRINE 10 MG TABLET
5.0000 mg | ORAL_TABLET | ORAL | Status: AC
Start: 2023-06-06 — End: 2023-06-06
  Administered 2023-06-06: 5 mg via ORAL
  Filled 2023-06-06: qty 1

## 2023-06-06 MED ORDER — TRAMADOL 50 MG TABLET
1.0000 | ORAL_TABLET | Freq: Four times a day (QID) | ORAL | 0 refills | Status: DC | PRN
Start: 2023-06-06 — End: 2023-06-26

## 2023-06-06 MED ORDER — HYDROCODONE 5 MG-ACETAMINOPHEN 325 MG TABLET
1.0000 | ORAL_TABLET | ORAL | Status: AC
Start: 2023-06-06 — End: 2023-06-06
  Administered 2023-06-06: 1 via ORAL
  Filled 2023-06-06: qty 1

## 2023-06-06 MED ORDER — HYDROCODONE-ACETAMINOPHEN 5 MG-325 MG TABLET - ED TO GO MED
1.0000 | ORAL_TABLET | Freq: Four times a day (QID) | ORAL | Status: DC | PRN
Start: 2023-06-06 — End: 2023-06-06
  Administered 2023-06-06: 1 via ORAL
  Filled 2023-06-06: qty 4

## 2023-06-06 MED ORDER — CYCLOBENZAPRINE 5 MG TABLET
5.0000 mg | ORAL_TABLET | Freq: Three times a day (TID) | ORAL | 0 refills | Status: AC | PRN
Start: 2023-06-06 — End: ?

## 2023-06-06 MED ORDER — CYCLOBENZAPRINE 10 MG TABLET - ED TO GO MED
5.0000 mg | ORAL_TABLET | Freq: Three times a day (TID) | ORAL | Status: DC | PRN
Start: 2023-06-06 — End: 2023-06-06
  Administered 2023-06-06: 5 mg via ORAL
  Filled 2023-06-06: qty 4

## 2023-06-06 NOTE — ED Nurses Note (Signed)
Patient and family provided with thorough verbal and written discharge instructions. To go medication reviewed with all patient questions answered. Patient and family deny further needs. Assisted to vehicle with w/c. DC home.

## 2023-06-06 NOTE — ED Provider Notes (Signed)
[place]  DEPARTMENT OF EMERGENCY MEDICINE  EMERGENCY DEPARTMENT HISTORY AND PHYSICAL      Chief Complaint:    Patient is a 89 y.o.  female presenting to the [facility] with chief complaint of back pain.     History of Present Illness:    89 year old oxygen-dependent female presents to emergency room with family members by POV with complaint of worsening low back pain.  Family members report that patient has had consistent pain across the tops of both hips over the last 2 weeks it of progressively worsened over the last several days.  They report using a topical NSAID with some Tylenol patient denies relief of symptoms.  Patient reports pain is sharp located across the belt line and is equally painful on both sides.    Patient and family members deny any fall trauma accident injury or other aggravating event.  Patient denies numbness tingling pins and needles sensation.  No radiation of pain.  No saddle anesthesias or paresthesias no loss of bowel or bladder control no footdrop.      Review of Systems:    Constitutional: No fever, chills, weight loss, or exercise intolerance. No fatigue  EENT: No ear pain or discharge. No eye pain or vision changes. No epistaxis. No sore throat.  No nasal congestion  Cardio: No chest pain, no irregular heart beats, no palpitations.  Pulm: No shortness of breath, no cough, no wheezing  GI: No abdominal pain. No nausea, vomiting, constipation, or diarrhea. No bloody stool.  GU: No dysuria, no frequency, no dribbling no hesitation.  Skin: No rashes, lesions, bleeding, or bruising.  MSK: No joint pain. No neck pain.  + bilateral low back pain. No swelling in extremities.  Neuro: No numbness, tingling, or weakness. No headache. No dizziness.  Psych: No SI or HI. Normal mood   All other symptoms reviewed and are negative, unless commented on in the HPI.    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/07/2023    Wears dentures      Past Surgical History:   Procedure Laterality Date    Coronary artery angioplasty      Hx adenoidectomy      Hx aortic valve replacement      Hx appendectomy      Hx cataract removal      Hx cholecystectomy      Hx heart catheterization Left     Hx hysterectomy      Hx tonsillectomy      Percutaneous balloon valvuloplasty  07/09/2020     Family Hx:   Family History   Family history unknown: Yes     Allergies:   Allergies   Allergen Reactions    Adhesive Tape-Silicones Rash     Medications:   Prior to Admission Medications   Prescriptions Last Dose Informant Patient Reported? Taking?   ascorbic acid, vitamin C, (VITAMIN C) 500 mg Oral Tablet   Yes No   Sig: Take 1 Tablet (500 mg total) by mouth Once a day   aspirin (ECOTRIN) 81 mg Oral Tablet, Delayed Release (E.C.)   Yes No   Sig:  Take 1 Tablet (81 mg total) by mouth Once a day   atorvastatin (LIPITOR) 10 mg Oral Tablet   Yes No   Sig: Take 1 Tablet (10 mg total) by mouth Every evening   calcium carbonate/vitamin D3 (CALCIUM WITH VITAMIN D3 ORAL)   Yes No   Sig: Take 1 Tablet by mouth Once a day Calcium and Vitamin D combination 600 mg- 200 intl units   cholecalciferol, vitamin D3, 25 mcg (1,000 unit) Oral Tablet   Yes No   Sig: Take 1 Tablet (1,000 Units total) by mouth Once a day   ferrous sulfate (FERATAB) 324 mg (65 mg iron) Oral Tablet, Delayed Release (E.C.)   Yes No   Sig: Take 1 Tablet (324 mg total) by mouth   furosemide (LASIX) 20 mg Oral Tablet   Yes No   Sig: Take 1 Tablet (20 mg total) by mouth Once a day   insulin glargine 100 unit/mL Subcutaneous injection (vial)   Yes No   Sig: Inject 10 Units under the skin Every night   metOLazone (ZAROXOLYN) 2.5 mg Oral Tablet   Yes No   Sig: Take 1 Tablet  (2.5 mg total) by mouth Once a day   metoprolol succinate (TOPROL-XL) 25 mg Oral Tablet Sustained Release 24 hr   No No   Sig: Take 0.5 Tablets (12.5 mg total) by mouth Once a day   multivitamin Oral Tablet   Yes No   Sig: Take 1 Tablet by mouth Once a day   nitroGLYCERIN (NITROSTAT) 0.4 mg Sublingual Tablet, Sublingual   Yes No   Sig: Place 1 Tablet (0.4 mg total) under the tongue Every 5 minutes as needed for Chest pain for 3 doses over 15 minutes   omega-3-DHA-EPA-fish oil (FISH OIL) 1,000 mg (120 mg-180 mg) Oral Capsule   Yes No   Sig: Take 1 Capsule by mouth Once a day   repaglinide (PRANDIN) 0.5 mg Oral Tablet   Yes No   Sig: Take 1 Tablet (0.5 mg total) by mouth Three times daily before meals   vitamin B complex Oral Tablet   Yes No   Sig: Take 1 Tablet by mouth Once a day      Facility-Administered Medications: None       Social History:    Social History     Tobacco Use    Smoking status: Former     Current packs/day: 0.00     Types: Cigarettes     Quit date: 1950     Years since quitting: 74.8     Passive exposure: Past    Smokeless tobacco: Never   Vaping Use    Vaping status: Never Used   Substance Use Topics    Alcohol use: Never    Drug use: Never       Above history reviewed with patient.  Allergies, medication list, and old records also reviewed.     Filed Vitals:    06/06/23 1447 06/06/23 1750 06/06/23 1833   BP: (!) 133/58     Pulse: 91 82 80   Resp: 20 20 18    Temp: 36.5 C (97.7 F)     SpO2: (!) 85% 97% 99%       Physical Exam:       Nursing note and vitals reviewed.  Vital signs reviewed as above. No acute distress.   Constitutional: Pt is well-developed and well-nourished. Appears stated age.  Head: Normocephalic and atraumatic.   Eyes: Conjunctivae and sclera are normal. Pupils  are equal, round, and reactive to light. EOM are intact  Ears:  External ear and canals are normal and without discharge,   Nose:  External normal. Nares normal bilaterally.  Mouth/Throat:  Moist mucous membranes.    Neck: Soft, supple, trachea is midline, with full range of motion.   Pulmonary/Chest: RRR Distal pulses present. No swelling of BLE.  Normal respiratory effort. No respiratory distress.   Abd/GI: soft non-tender non-distended   Musculoskeletal: Normal range of motion. No obvious deformities. No swelling of BLE  Neurological: CNs 2-12 grossly intact.  No gross motor or sensory focal deficits noted.  Unable to fully assess gait and balance.  Skin: Warm dry and intact. No rash or lesions  Psychiatric: Patient has a normal mood and affect is congruent with mood.       Labs:      Results for orders placed or performed during the hospital encounter of 06/06/23   URINALYSIS, MACROSCOPIC   Result Value Ref Range    COLOR Yellow Yellow, Light Yellow, Colorless    APPEARANCE Clear Clear    PH 5.5 5.0 - 8.0    LEUKOCYTES Negative Negative WBCs/uL    NITRITE Negative Negative    PROTEIN Negative Negative, 10  mg/dL    GLUCOSE Negative Negative mg/dL    KETONES Negative Negative mg/dL    UROBILINOGEN < 2.0 < 2.0, 1.0, 2.0 , 0.2  mg/dL    BILIRUBIN Negative Negative mg/dL    BLOOD Negative Negative mg/dL    SPECIFIC GRAVITY 8.295 1.002 - 1.030   URINALYSIS, MICROSCOPIC   Result Value Ref Range    RBCS 0-2 0-2, None /hpf    WBCS 2-5 (A) 0-2, None /hpf    BACTERIA None None /hpf    SQUAMOUS EPITHELIAL 0-2 0-2, None /hpf    MUCOUS Slight None, Slight /hpf       Imaging:    Results for orders placed or performed during the hospital encounter of 06/06/23 (from the past 72 hour(s))   CT LUMBAR SPINE WO IV CONTRAST     Status: None    Narrative    Dannilynn Faron    PROCEDURE DESCRIPTION: CT LUMBAR SPINE WO IV CONTRAST    CLINICAL INDICATION: pain radiates across bilateral low back and pelvis    The CT exam was performed using one or more of the following dose reduction techniques: automated exposure control, adjustment of the mA and/or kV according to the patient's size, or use of iterative reconstruction technique.    There are  no comparison lumbar CT studies. The patient had a prior CT chest on 02/16/23.    FINDINGS:  Mild T11 compression fracture is present without retropulsion which is likely acute. It was not present on prior CT chest on 02/06/23.    Severe T12 compression fracture is old and unchanged from 02/06/23.    L1 and L2 are intact.    At L3 there is a Schmorl's node at the inferior endplate without acute fracture.    At L4 there is mild compression fracture without retropulsion which is suspected to be chronic.     L5 is intact.     There are significant degenerative changes in the lower lumbar spine with disc space narrowing, endplate spurring, facet hypertrophy.      Impression    1. Acute mild T11 compression fracture  2. Old T12 compression fracture.  3. Old L4 compression fracture which is likely chronic.      Radiologist location ID: [id number]  CT THORACIC SPINE WO IV CONTRAST     Status: None    Narrative    Taraoluwa Pagett    PROCEDURE DESCRIPTION: CT THORACIC SPINE WO IV CONTRAST    CLINICAL INDICATION: pain denies injury    The CT exam was performed using one or more of the following dose reduction techniques: automated exposure control, adjustment of the mA and/or kV according to the patient's size, or use of iterative reconstruction technique.    Findings:  Mild T11 compression fracture is present without retropulsion which appears acute.    Severe T12 compression fracture is present with mild retropulsion, unchanged from prior study.    No additional acute fracture is seen. There is stable mild concavity of the endplates of T5, T6, T7 likely physiologic. Mild degenerative changes are present several levels of the thoracic spine.    Small bilateral cervical ribs are present at C7. There are multilevel degenerative changes of the cervical spine. There is nonspecific ground glass airspace disease throughout both lungs. Small bilateral pleural effusions are present. There has been prior TAVR.      Impression     Impression:  1. Mild T11 compression fracture which appears acute.  2. Old T12 compression fracture.  3. Nonspecific bilateral groundglass airspace disease with small bilateral pleural effusions.      Radiologist location ID: [id number]         Orders Placed This Encounter    CT THORACIC SPINE WO IV CONTRAST    CT LUMBAR SPINE WO IV CONTRAST    URINALYSIS, MACROSCOPIC AND MICROSCOPIC W/CULTURE REFLEX    URINALYSIS, MACROSCOPIC    URINALYSIS, MICROSCOPIC    lidocaine 4% patch    HYDROcodone-acetaminophen (NORCO) 5-325 mg per tablet    cyclobenzaprine (FLEXERIL) tablet    cyclobenzaprine (FLEXERIL) 10 mg tablet - ED To Go    HYDROcodone-acetaminophen (NORCO) 5-325 mg per tablet - ED To Go    cyclobenzaprine (FLEXERIL) 5 mg Oral Tablet    traMADoL (ULTRAM) 50 mg Oral Tablet           MDM:   Acute pain treated with p.o. hydrocodone 5 mg cyclobenzaprine and topical lidocaine patch.  Family members requesting for home medications to use this evening prior to getting medications at pharmacy tomorrow.  Hydrocodone ED to go pack provided.  Flexeril ED to go pack provided with instructions to take half tablet.  Appropriate labs and imaging ordered as indicated by HPI and findings on physical exam.  Records review completed, thorough review of previous encounters, notes, labs, imaging, medications, allergies, treatments/therapies, and established plan of care completed today.  PDMP reviewed NARC score 120 , Sedative score 50, Overdose score 0.  Labs reviewed by myself results discussed with patient and family member in detail.  Imaging reviewed by myself report discussed with patient and family member in detail.   Illness process discussed with patient and family member.    Recommend close follow-up with PCP.  Advised to follow up with PCP 1-2 days.    Patient was given the opportunity to ask any questions prior to discharge.  Patient was involved in medical decision making and is agreeable to treatment/discharge  plan.  Following the above history, physical exam, and studies, the patient was deemed stable and suitable for discharge.   The patient was advised to return to the ED for any new or worsening symptoms. Discharge, medication and follow up instructions were discussed with the patient, who verbalized understanding.  The patient is comfortable with the  plan of care.    Impression:   Clinical Impression   Compression fracture of lumbar vertebra, sequela (Primary)   Compression fracture of body of thoracic vertebra (CMS HCC)   Acute bilateral low back pain without sciatica       Disposition/Plan:  Discharged to home in care of family member with prescription for tramadol and Flexeril to use as needed.  Please follow up with PCP.  Precautions to return to emergency care center discussed.   Follow Up:   Demetria Pore, PA-C  [address]  [place] [address]  [phone]    Schedule an appointment as soon as possible for a visit       Pt was given a prescription for a controlled substance.   A comprehensive pain management plan for the patient was developed and discussed with the patient. The patient's previous use of Tylenol and experience with non-opioid and opioid medications as well as other non-pharmacological therapies was reviewed. The patient's history in regard to substance abuse and information from the Controlled Substance Monitoring Program was reviewed. Alternative treatments including non-pharmacological pain management options, were recommended as a part of the comprehensive the pain management treatment plan.  The risks, benefits, and availability of these alternative treatment options were reviewed.  Non-opioid pharmacological pain management options were recommended and the risks and benefits of these options were reviewed.  Opioid pain management options were discussed, including the risks and benefits of these options.  Specifically, the patient was advised that opioids are highly  addictive, even when taken as prescribed, that there is a risk of developing a physical or psychological dependence on the controlled substance, and that the risks of taking more opioids than prescribed, or mixing sedatives, benzodiazepines, or alcohol with opioids, can result in fatal respiratory depression.  It was determined that a prescription for a controlled medication is a necessary and appropriate part of the patient's comprehensive pain management plan at this time.  The patient was advised they would be receiving a prescription for 12 doses of tramadol at discharge.     The patient was advised they could fill the prescription for a lesser amount if they chose.  They were advised if they did choose to partially fill the prescription they might not be able to get an addition opioid prescription for several days if they ran out of medication.  The patient and/or their guardian were given an opportunity to ask questions about the comprehensive pain management plan and their questions were answered.      Prescriptions:   New Prescriptions    CYCLOBENZAPRINE (FLEXERIL) 5 MG ORAL TABLET    Take 1 Tablet (5 mg total) by mouth Three times a day as needed for Muscle spasms    TRAMADOL (ULTRAM) 50 MG ORAL TABLET    Take 1 Tablet (50 mg total) by mouth Every 6 hours as needed for Pain        Future Appointments   Date Time Provider Department Center   06/16/2023 11:00 AM Cheri Rous, MD [facility],       Kaydie Petsch L.Malgorzata Albert DMSc PA-C  Benson Norway, PA-C  06/06/2023, 18:12    Chart was dictated using voice recognition software, which may lead to minor grammatical or syntax errors.

## 2023-06-06 NOTE — ED Nurses Note (Signed)
Bedside nursing report receievd from A. Houston LPN. Patient resting in bed with eyes open. Denies pain at this time. Requested and received ice water. Denies further needs. Visitor x 2 at bedside.

## 2023-06-06 NOTE — ED Nurses Note (Signed)
Gentleman with pt states that she hasn't had her oxygen on since they left home which was a 30 minutes drive here, he states she usually wears  oxygen at 4L.  Pt was 85% on room air, pt placed on 4 lNC ans she came up to 96%

## 2023-06-06 NOTE — ED Triage Notes (Signed)
Lower back pain, across the hips, for the past 2 weeks, seems to be getting worse instead of better. Denys any fall or trauma

## 2023-06-16 ENCOUNTER — Ambulatory Visit (INDEPENDENT_AMBULATORY_CARE_PROVIDER_SITE_OTHER): Payer: Self-pay | Admitting: INTERNAL MEDICINE - CARDIOVASCULAR DISEASE

## 2023-06-26 ENCOUNTER — Encounter (HOSPITAL_BASED_OUTPATIENT_CLINIC_OR_DEPARTMENT_OTHER): Payer: Self-pay | Admitting: Neurological Surgery

## 2023-06-26 ENCOUNTER — Encounter (HOSPITAL_BASED_OUTPATIENT_CLINIC_OR_DEPARTMENT_OTHER): Payer: Self-pay | Admitting: Physical Medicine & Rehabilitation

## 2023-06-26 ENCOUNTER — Ambulatory Visit (HOSPITAL_BASED_OUTPATIENT_CLINIC_OR_DEPARTMENT_OTHER): Payer: Medicare PPO | Admitting: Neurological Surgery

## 2023-06-26 ENCOUNTER — Other Ambulatory Visit: Payer: Self-pay

## 2023-06-26 ENCOUNTER — Ambulatory Visit: Payer: Medicare PPO | Attending: Physical Medicine & Rehabilitation | Admitting: Physical Medicine & Rehabilitation

## 2023-06-26 VITALS — BP 121/74 | HR 100 | Temp 97.9°F | Resp 16 | Ht 59.0 in | Wt 100.1 lb

## 2023-06-26 VITALS — BP 121/74 | HR 100 | Temp 97.7°F | Ht 59.0 in | Wt 100.1 lb

## 2023-06-26 DIAGNOSIS — X58XXXA Exposure to other specified factors, initial encounter: Secondary | ICD-10-CM

## 2023-06-26 DIAGNOSIS — R52 Pain, unspecified: Secondary | ICD-10-CM | POA: Insufficient documentation

## 2023-06-26 DIAGNOSIS — S32000A Wedge compression fracture of unspecified lumbar vertebra, initial encounter for closed fracture: Secondary | ICD-10-CM

## 2023-06-26 DIAGNOSIS — S22080A Wedge compression fracture of T11-T12 vertebra, initial encounter for closed fracture: Secondary | ICD-10-CM

## 2023-06-26 DIAGNOSIS — S22000A Wedge compression fracture of unspecified thoracic vertebra, initial encounter for closed fracture: Secondary | ICD-10-CM

## 2023-06-26 DIAGNOSIS — Z8781 Personal history of (healed) traumatic fracture: Secondary | ICD-10-CM

## 2023-06-26 MED ORDER — HYDROCODONE 5 MG-ACETAMINOPHEN 325 MG TABLET
1.0000 | ORAL_TABLET | Freq: Four times a day (QID) | ORAL | 0 refills | Status: DC | PRN
Start: 2023-06-26 — End: 2023-07-21

## 2023-06-26 NOTE — H&P (Signed)
Department of Neurosurgery  Consultation    Name:  Alicia Moody     MR#:    [id number]   DOB:  Oct 11, 1925     Date/Time: 06/26/2023, 15:37  ______________________________________________________________________    Assessment:   ENCOUNTER DIAGNOSES     ICD-10-CM   1. Closed wedge compression fracture of T11 vertebra, initial encounter (CMS HCC)  S22.080A   2. Lumbar compression fracture (CMS HCC)  S32.000A   3. Thoracic compression fracture (CMS HCC)  S22.000A   4. Severe pain  R52        Impression: 89 y/o female presenting with severe thoracolumbar pain secondary to acute T11 compression fracture.      Plan:  -Script for Norco 5/325 mg take 1 tab Q 6 hours PRN for pain # 42, no refills.  --provided for patient's intractable, fracture related pain.     -Diagnosis requiring prescription:  acute T11 compression fracture with severe pain      Medication Dosage/Frequency being prescribed:  Norco 5/325 1-2 tablets every 4-6 hours p.r.n. for pain number 60 with 0 refill     I have reviewed prior medication history in the medical record for this patient.  I have also reviewed information contained in the state controlled prescription drug monitoring database.     [facility] was last reviewed on 06/26/2023 16:22      Based on this, I have no concerns for prescribing controlled substances at this time.     The patient denies history of substance abuse treatment.       Physical exam findings and/or clinical history warranting use of opioid treatment include:  expected to have post-operative/incisional pain     My goals for treatment include:  Control postoperative pain/incisional pain     I have discussed the risk of opioid addiction with the patient.  I have also discussed the risk of using sedatives and alcohol while taking opioids. Based on my assessment, I believe the planned course of treatment to be safe and effective.            -No lifting > 5lbs. Avoid forward flexion.  Wear brace when OOB/PRN.   -RTC in 4 weeks  with thoracolumbar XRAY [ordered]    Patient evaluated independently.  Dr Idolina Primer authorized hydrocodone prescription only.     Domingo Pulse, PA-C  [place] [facility] of Neurosurgery   06/26/2023 3:37 PM   _______________________________________________________________________    Chief Complaint:  Back pain    History of Present Illness:  Alicia Moody  is a 89 y.o. female who has been referred to the Neurosurgery and Sp[facility] Cl[facility] with complaints of severe lower back pain (thoracolumbar region). Patient evaluated in the [facility] Emergency Dept on 06-06-23 and notes her pain progressively worsened over the last 2 weeks prior to that assessment. Denies any trauma/fall/injury or aggravating event.  Denies lower extremity radicular pain. Denies paresthesias of lower extremities.    Denies saddle anesthesia. Presents in wheelchair. Most history comes from family.   Patient in wheelchair.   Saw physiatry today whom asked if we could further evaluate the patient and treat pain.   Patient has back brace from history of T12 and L4 fractures in past.     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           Allergies:  Allergies   Allergen Reactions    Adhesive Tape-Silicones Rash       Home Medications:  Prior to Admission medications    Medication Sig Start Date End Date Taking? Authorizing Provider   ascorbic acid, vitamin C, (VITAMIN C) 500 mg Oral Tablet Take 1 Tablet (500 mg total) by mouth Once a day   Yes Provider, Historical   aspirin (ECOTRIN) 81 mg Oral Tablet, Delayed Release (E.C.) Take 1 Tablet (81 mg total) by mouth Once a day   Yes Provider, Historical   atorvastatin (LIPITOR) 10 mg Oral Tablet Take 1 Tablet (10 mg total) by mouth Every evening   Yes Provider, Historical   calcium carbonate/vitamin D3 (CALCIUM WITH VITAMIN D3 ORAL) Take 1 Tablet by mouth Once a day Calcium and Vitamin D combination 600 mg- 200 intl units   Yes Provider, Historical   cholecalciferol, vitamin D3, 25 mcg (1,000 unit) Oral Tablet Take 1 Tablet (1,000 Units total) by mouth Once a day   Yes Provider, Historical   cyclobenzaprine (FLEXERIL) 5 mg Oral Tablet Take 1 Tablet (5 mg total) by mouth Three times a day as needed for Muscle spasms 06/06/23  Yes Cox, Jamie, PA-C   ferrous sulfate (FERATAB) 324 mg (65 mg iron) Oral Tablet, Delayed Release (E.C.) Take 1 Tablet (324 mg total) by mouth   Yes Provider, Historical   furosemide (LASIX) 20 mg Oral Tablet Take 1 Tablet (20 mg total) by mouth Once a day   Yes Provider, Historical   HYDROcodone-acetaminophen (NORCO) 5-325 mg Oral Tablet Take 1 Tablet by mouth Every 6 hours as needed for Pain for up to 11 days 06/26/23 07/07/23 Yes Debbra Riding, MD   insulin glargine 100 unit/mL Subcutaneous injection (vial) Inject 10 Units under the skin Every night   Yes Provider, Historical   metOLazone (ZAROXOLYN) 2.5 mg Oral Tablet Take 1 Tablet (2.5 mg total) by mouth Once a day   Yes Provider, Historical   metoprolol succinate (TOPROL-XL) 25 mg Oral Tablet Sustained Release 24 hr Take 0.5 Tablets (12.5 mg total) by mouth Once a day 02/08/23  Yes Claria Dice, DO    multivitamin Oral Tablet Take 1 Tablet by mouth Once a day   Yes Provider, Historical   nitroGLYCERIN (NITROSTAT) 0.4 mg Sublingual Tablet, Sublingual Place 1 Tablet (0.4 mg total) under the tongue Every 5 minutes as needed for Chest pain for 3 doses over 15 minutes    Provider, Historical   omega-3-DHA-EPA-fish  oil (FISH OIL) 1,000 mg (120 mg-180 mg) Oral Capsule Take 1 Capsule by mouth Once a day  Patient not taking: Reported on 06/26/2023    Provider, Historical   repaglinide (PRANDIN) 0.5 mg Oral Tablet Take 1 Tablet (0.5 mg total) by mouth Three times daily before meals   Yes Provider, Historical   vitamin B complex Oral Tablet Take 1 Tablet by mouth Once a day   Yes Provider, Historical   HYDROcodone-acetaminophen (NORCO) 5-325 mg Oral Tablet Take 1 Tablet by mouth Every 4 hours as needed for Pain  06/26/23  Provider, Historical   traMADoL (ULTRAM) 50 mg Oral Tablet Take 1 Tablet (50 mg total) by mouth Every 6 hours as needed for Pain 06/06/23 06/26/23  Cox, Asher Muir, PA-C       Social History:   Social History     Tobacco Use    Smoking status: Former     Current packs/day: 0.00     Types: Cigarettes     Quit date: 1950     Years since quitting: 74.8     Passive exposure: Past    Smokeless tobacco: Never   Vaping Use    Vaping status: Never Used   Substance Use Topics    Alcohol use: Never    Drug use: Never        Review of Systems:  Constitutional: no weight loss, fever, night sweats  GI: No change in bowel habits, No Nausea, vomitting, diarrhea, or constipation  GU: Negative for dysuria, frequency and incontinence  Neuro: negative    Physical Exam:  BP 121/74   Pulse 100   Temp 36.6 C (97.9 F)   Resp 16   Ht 1.499 m (4\' 11" )   Wt 45.4 kg (100 lb 1.4 oz)   BMI 20.22 kg/m         Lumbar Spine:   Palpation:  Tenderness at midline along the spinous processes, Tenderness along the paraspinal musculature  ROM:  Range of motion is limited by pain      Neurologic Exam:     Alert and oriented x3 with normal  speech.    Sensation:  Intact to light touch in lower extremities.    Gait: deferred, in 10/10 pain, in wheelchair     Motor examination:   Leg Right Left   Iliopsoas (L2) 5/5 5/5        Quadricep (L3/4) 5/5 5/5   Anterior Tibialis (L5) 5/5 5/5   Gastroc-soleus (S1) 5/5 5/5            Imaging:  I reviewed the study(ies) myself and made my own interpretation.  My interpretation can be found in the HPI and/or Assessment.  The following is the radiology report:    CT THORACIC SPINE WO IV CONTRAST     Collection Time: 06/06/23  7:14 PM     Narrative     Vonette Dooly     PROCEDURE DESCRIPTION: CT THORACIC SPINE WO IV CONTRAST     CLINICAL INDICATION: pain denies injury     The CT exam was performed using one or more of the following dose reduction techniques: automated exposure control, adjustment of the mA and/or kV according to the patient's size, or use of iterative reconstruction technique.     Findings:  Mild T11 compression fracture is present without retropulsion which appears acute.     Severe T12 compression fracture is present with mild retropulsion, unchanged from prior study.     No additional acute fracture  is seen. There is stable mild concavity of the endplates of T5, T6, T7 likely physiologic. Mild degenerative changes are present several levels of the thoracic spine.     Small bilateral cervical ribs are present at C7. There are multilevel degenerative changes of the cervical spine. There is nonspecific ground glass airspace disease throughout both lungs. Small bilateral pleural effusions are present. There has been prior TAVR.        Impression     Impression:  1. Mild T11 compression fracture which appears acute.  2. Old T12 compression fracture.  3. Nonspecific bilateral groundglass airspace disease with small bilateral pleural effusions.        Radiologist location ID: [id number]             Recent Results (from the past 53664 hour(s))   CT LUMBAR SPINE WO IV CONTRAST     Collection Time:  06/06/23  7:13 PM     Narrative     Breanah Hitt     PROCEDURE DESCRIPTION: CT LUMBAR SPINE WO IV CONTRAST     CLINICAL INDICATION: pain radiates across bilateral low back and pelvis     The CT exam was performed using one or more of the following dose reduction techniques: automated exposure control, adjustment of the mA and/or kV according to the patient's size, or use of iterative reconstruction technique.     There are no comparison lumbar CT studies. The patient had a prior CT chest on 02/16/23.     FINDINGS:  Mild T11 compression fracture is present without retropulsion which is likely acute. It was not present on prior CT chest on 02/06/23.     Severe T12 compression fracture is old and unchanged from 02/06/23.     L1 and L2 are intact.     At L3 there is a Schmorl's node at the inferior endplate without acute fracture.     At L4 there is mild compression fracture without retropulsion which is suspected to be chronic.      L5 is intact.      There are significant degenerative changes in the lower lumbar spine with disc space narrowing, endplate spurring, facet hypertrophy.        Impression     1. Acute mild T11 compression fracture  2. Old T12 compression fracture.  3. Old L4 compression fracture which is likely chronic.        Radiologist location ID: [id number]

## 2023-06-26 NOTE — H&P (Signed)
Physical Medicine and Rehabilitation New Patient Visit  Patient Name: Alicia Moody  MRN: [id number]  DOB: September 16, 1925  DOS: 06/26/2023    Chief Complaint:   Chief Complaint   Patient presents with     Pain     Compression fx T11.  Started 2x weeks ago.        History of Present Illness: Alicia Moody is a pleasant 89 y.o.female who presents to [facility] at [facility] for initial evaluation for the above stated complaint. The patient complains of low back pain. Patient reports her pain worsened in the last few weeks. Patient went to [facility] ED for the pain and was told she has a T11 compression fracture. The pain is exacerbated with movement, but improves with rest. Pain scale: 10/10. The patient denies sxs of fever, chills, night sweats, weight changes, chest pain, shortness of breath, appetite changes, bowel or bladder incontinence, saddle paresthesias, headaches, dizziness, sleep changes, and all other complaints at this time. PMHx includes: T2DM, HTN. PSHx includes: hysterectomy. SHx includes: former smoker. The patient denies a personal history of cancer.     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    Wears dentures            Past Surgical History:  Past Surgical History:   Procedure Laterality Date    CORONARY ARTERY ANGIOPLASTY      HX ADENOIDECTOMY      HX AORTIC VALVE REPLACEMENT      TAVR    HX APPENDECTOMY      HX CATARACT REMOVAL      HX CHOLECYSTECTOMY      HX HEART CATHETERIZATION Left     HX HYSTERECTOMY      HX TONSILLECTOMY      PERCUTANEOUS BALLOON VALVULOPLASTY  07/09/2020           Social History:  Social History      Socioeconomic History    Marital status: Divorced     Spouse name: Not on file    Number of children: Not on file    Years of education: Not on file    Highest education level: Not on file   Occupational History    Not on file   Tobacco Use    Smoking status: Former     Current packs/day: 0.00     Types: Cigarettes     Quit date: 1950     Years since quitting: 74.8     Passive exposure: Past    Smokeless tobacco: Never   Vaping Use    Vaping status: Never Used   Substance and Sexual Activity    Alcohol use: Never    Drug use: Never    Sexual activity: Not on file   Other Topics Concern    Not on file   Social History Narrative    Not on file     Social Determinants of Health     Financial Resource Strain: Not on file   Transportation Needs: Not on file   Social Connections: Low Risk  (02/06/2023)    Social  Connections     SDOH Social Isolation: 5 or more times a week   Intimate Partner Violence: Not on file   Housing Stability: Not on file       Family History:  Family Medical History:    Family history is unknown by patient.           Medications:  Outpatient Medications Marked as Taking for the 06/26/23 encounter (Office Visit) with Adela Ports, MD   Medication Sig    ascorbic acid, vitamin C, (VITAMIN C) 500 mg Oral Tablet Take 1 Tablet (500 mg total) by mouth Once a day    aspirin (ECOTRIN) 81 mg Oral Tablet, Delayed Release (E.C.) Take 1 Tablet (81 mg total) by mouth Once a day    atorvastatin (LIPITOR) 10 mg Oral Tablet Take 1 Tablet (10 mg total) by mouth Every evening    calcium carbonate/vitamin D3 (CALCIUM WITH VITAMIN D3 ORAL) Take 1 Tablet by mouth Once a day Calcium and Vitamin D combination 600 mg- 200 intl units    cholecalciferol, vitamin D3, 25 mcg (1,000 unit) Oral Tablet Take 1 Tablet (1,000 Units total) by mouth Once a day    cyclobenzaprine (FLEXERIL) 5 mg Oral Tablet Take 1 Tablet (5 mg total) by mouth Three times a day as needed for Muscle spasms    ferrous sulfate (FERATAB) 324 mg (65  mg iron) Oral Tablet, Delayed Release (E.C.) Take 1 Tablet (324 mg total) by mouth    furosemide (LASIX) 20 mg Oral Tablet Take 1 Tablet (20 mg total) by mouth Once a day    HYDROcodone-acetaminophen (NORCO) 5-325 mg Oral Tablet Take 1 Tablet by mouth Every 4 hours as needed for Pain    insulin glargine 100 unit/mL Subcutaneous injection (vial) Inject 10 Units under the skin Every night    metOLazone (ZAROXOLYN) 2.5 mg Oral Tablet Take 1 Tablet (2.5 mg total) by mouth Once a day    metoprolol succinate (TOPROL-XL) 25 mg Oral Tablet Sustained Release 24 hr Take 0.5 Tablets (12.5 mg total) by mouth Once a day    multivitamin Oral Tablet Take 1 Tablet by mouth Once a day    nitroGLYCERIN (NITROSTAT) 0.4 mg Sublingual Tablet, Sublingual Place 1 Tablet (0.4 mg total) under the tongue Every 5 minutes as needed for Chest pain for 3 doses over 15 minutes    omega-3-DHA-EPA-fish oil (FISH OIL) 1,000 mg (120 mg-180 mg) Oral Capsule Take 1 Capsule by mouth Once a day    repaglinide (PRANDIN) 0.5 mg Oral Tablet Take 1 Tablet (0.5 mg total) by mouth Three times daily before meals    traMADoL (ULTRAM) 50 mg Oral Tablet Take 1 Tablet (50 mg total) by mouth Every 6 hours as needed for Pain    vitamin B complex Oral Tablet Take 1 Tablet by mouth Once a day       Allergies:  Allergies   Allergen Reactions    Adhesive Tape-Silicones Rash       Review of Systems:   Constitutional: Negative for fever, chills, night sweats, or weight changes.   Eyes: Negative for vision changes.  Cardiovascular: Negative for chest pain or palpitations.  Respiratory: Negative for shortness of breath.  Gastrointestinal: Negative for abdominal pain, nausea, or vomiting.  Genitourinary: Negative for bladder incontinence.   Musculoskeletal: Positive for low back pain.  Neurological: Negative for bowel or bladder incontinence, saddle paresthesias, numbness, tingling, headaches, or dizziness.   Skin: Negative for rash.  Psychiatric: Negative for sleep changes.  All other systems reviewed and are negative. Please see scanned new patient packet.    Physical Examination:  BP 121/74   Pulse 100   Temp 36.5 C (97.7 F)   Ht 1.499 m (4\' 11" )   Wt 45.4 kg (100 lb 1.4 oz)   BMI 20.22 kg/m       Constitutional: 89 y.o. female in no acute distress. Awake. Cooperative. Alert and oriented x4.   HEENT: Normal oropharynx. Extra ocular movements intact. Normocephalic and atraumatic.   Neck: Supple, symmetric, trachea midline, no masses. The thyroid appears normal, no thyromegaly.   Cardiovascular: Regular rate and rhythm. No murmurs, rubs, or gallops. Normal pulses in all four extremities.  Respiratory: Lungs are clear to auscultation. No wheezing, rales, or rhonchi.  Gastrointestinal: Abdomen is soft and non-tender with normal bowel sounds. No hepatosplenomegaly.   Skin: Warm, dry, and no rashes.   Lymphatic: No palpable lymphadenopathy.  Neuromuscular: Alert and oriented x3 with normal speech. Attention span and concentration normal. Cognitive function is normal. Coordination is normal. Cranial nerves 2-12 intact. Sensation is normal in upper and lower extremities. Normal muscle tone in upper and lower extremities.   Gait: utilizing wheelchair,   Lumbar Spine: Patient has tenderness in the L4/S1, otherwise no masses, no deformity, no atrophy effusion or erythema. ROM: Flexion, extension, rotation, lateral bending are within normal limits. Pain with facet loading. Stability: no step off, stable spine.     Motor examination:   Arm Right Left Leg Right Left   Shoulder abduction (C5) 5/5 5/5 Hip flexion (L2) 5/5 5/5   Wrist extension (C6) 5/5 5/5 Knee extension (L3) 5/5 5/5   Wrist flexion (C7) 5/5 5/5 Foot Dorsi Flexion (L4) 5/5 5/5   Finger flexion (C8) 5/5 5/5 Toe extension (L5) 5/5 5/5   Finger ab/adduction (T1) 5/5 5/5 Foot plantar flexion (S1) 5/5 5/5   Reflexes:    Bicep BR Triceps Patella Achilles Babinski Ankle Clonus Hoffman's   Right 2+ 2+ 2+ 2+ 2+ Downgoing Not present  Not present   Left 2+ 2+ 2+ 2+ 2+ Downgoing Not present Not present   The patient was fully assessed, evaluated and examined today.    Diagnostic Studies:     Recent Results (from the past 81191 hour(s))   CT THORACIC SPINE WO IV CONTRAST    Collection Time: 06/06/23  7:14 PM    Narrative    Justine Zundel    PROCEDURE DESCRIPTION: CT THORACIC SPINE WO IV CONTRAST    CLINICAL INDICATION: pain denies injury    The CT exam was performed using one or more of the following dose reduction techniques: automated exposure control, adjustment of the mA and/or kV according to the patient's size, or use of iterative reconstruction technique.    Findings:  Mild T11 compression fracture is present without retropulsion which appears acute.    Severe T12 compression fracture is present with mild retropulsion, unchanged from prior study.    No additional acute fracture is seen. There is stable mild concavity of the endplates of T5, T6, T7 likely physiologic. Mild degenerative changes are present several levels of the thoracic spine.    Small bilateral cervical ribs are present at C7. There are multilevel degenerative changes of the cervical spine. There is nonspecific ground glass airspace disease throughout both lungs. Small bilateral pleural effusions are present. There has been prior TAVR.      Impression    Impression:  1. Mild T11 compression fracture which appears acute.  2. Old  T12 compression fracture.  3. Nonspecific bilateral groundglass airspace disease with small bilateral pleural effusions.      Radiologist location ID: [id number]       Recent Results (from the past 81191 hour(s))   CT LUMBAR SPINE WO IV CONTRAST    Collection Time: 06/06/23  7:13 PM    Narrative    Bellamarie Brinton    PROCEDURE DESCRIPTION: CT LUMBAR SPINE WO IV CONTRAST    CLINICAL INDICATION: pain radiates across bilateral low back and pelvis    The CT exam was performed using one or more of the following dose reduction techniques: automated  exposure control, adjustment of the mA and/or kV according to the patient's size, or use of iterative reconstruction technique.    There are no comparison lumbar CT studies. The patient had a prior CT chest on 02/16/23.    FINDINGS:  Mild T11 compression fracture is present without retropulsion which is likely acute. It was not present on prior CT chest on 02/06/23.    Severe T12 compression fracture is old and unchanged from 02/06/23.    L1 and L2 are intact.    At L3 there is a Schmorl's node at the inferior endplate without acute fracture.    At L4 there is mild compression fracture without retropulsion which is suspected to be chronic.     L5 is intact.     There are significant degenerative changes in the lower lumbar spine with disc space narrowing, endplate spurring, facet hypertrophy.      Impression    1. Acute mild T11 compression fracture  2. Old T12 compression fracture.  3. Old L4 compression fracture which is likely chronic.      Radiologist location ID: [id number]             Assessment and Plan:     ICD-10-CM    1. Lumbar compression fracture (CMS HCC)  S32.000A Refer to [facility]      2. Thoracic compression fracture (CMS HCC)  S22.000A Refer to [facility] Neurosurgery               Discussed prior imaging and clinical findings with patient. Referred patient to [facility] Neurosurgery. Patients son reports that the patient has a back brace for previous fractures. Patients son reports that he and patient arrive today with intent to receive opioids for pain control of the patient. Our clinic does not prescribe opioids.  Will follow up as needed, unless otherwise noted. Patient expressed understanding of this plan and has no further questions at this time.     I am scribing for, and in the presence of, Mackey Birchwood, MD for services provided on 06/26/2023.  Dell Ponto, SCRIBE     I personally performed the services described in this documentation, as scribed  in my presence, and it is both accurate   and complete.    Adela Ports, MD      Mackey Birchwood, MD  Medical Director, Spine and Pain [facility] Department of Neurosurgery

## 2023-06-30 NOTE — H&P (Signed)
[facility]  [facility]  CARDIOLOGY, [address]  [place] [address]     PATIENT NAME: Alicia Moody NUMBER: [id number]  DATE OF SERVICE: 07/03/23  DATE OF BIRTH: February 29, 1926  Age: 89 y.o.    Requesting Physician: Self, Referral     CLINIC NOTE     CHIEF COMPLAINT:  establishment of care     SUBJECTIVE:    Today I had the pleasure of seeing Alicia Moody at [facility] outpatient clinic in consultation for establishment of care.    As you know, she is a very pleasant 89 y.o.  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    She presents today to establish care and has been experiencing worsening shortness of breath despite using O2 continuously. She denies any history of  MI, or CVA and states she has not received a diagnosis of COPD. She has home health to assist with daily activities and she is compliant with home monitoring of blood pressure per home health log. She reports these readings show normal values. She has increased lower extremity edema that has been relieved with Lasix.    Currently, patient denies chest pain, syncope, palpitations, dyspnea on exertion, orthopnea, intermittent claudication.  Denies hospitalizations or ER visits due to cardiac-related comorbidities.    Patient denies use of recreational drugs, ETOH abuse or cigarette smoking. Patient admits to be compliant with medications and diet.                            Problem List     Patient Active Problem List   Diagnosis    Acute on chronic respiratory failure with hypoxia (CMS HCC)    Acute on chronic congestive heart failure (CMS HCC)    Bradycardia    COPD (chronic obstructive pulmonary disease) (CMS HCC)    DM2 (diabetes mellitus, type 2) (CMS HCC)    Aortic stenosis    S/p TAVR (transcatheter aortic valve replacement),  bioprosthetic    S/P balloon mitral valvuloplasty    Ischemic cardiomyopathy     Past History  Current Outpatient Medications   Medication Sig    ascorbic acid, vitamin C, (VITAMIN C) 500 mg Oral Tablet Take 1 Tablet (500 mg total) by mouth Once a day    atorvastatin (LIPITOR) 10 mg Oral Tablet Take 1 Tablet (10 mg total) by mouth Every evening    calcium carbonate/vitamin D3 (CALCIUM WITH VITAMIN D3 ORAL) Take 1 Tablet by mouth Once a day Calcium and Vitamin D combination 600 mg- 200 intl units    cholecalciferol, vitamin D3, 25 mcg (1,000 unit) Oral Tablet Take 1 Tablet (1,000 Units total) by mouth Once a day    cyclobenzaprine (FLEXERIL) 5 mg Oral Tablet Take 1 Tablet (5 mg total) by mouth Three times a day as needed for Muscle spasms    ferrous sulfate (FERATAB) 324 mg (65 mg iron) Oral Tablet, Delayed Release (E.C.) Take 1 Tablet (324 mg total) by mouth    furosemide (LASIX) 20 mg Oral Tablet Take 1 Tablet (20 mg total) by mouth Once a day (Patient not taking: Reported on 07/03/2023)    HYDROcodone-acetaminophen (NORCO) 5-325 mg Oral Tablet Take 1 Tablet by mouth Every 6 hours as needed for Pain for up to 11 days  insulin glargine 100 unit/mL Subcutaneous injection (vial) Inject 10 Units under the skin Every night    metOLazone (ZAROXOLYN) 2.5 mg Oral Tablet Take 1 Tablet (2.5 mg total) by mouth Once per day as needed    metoprolol succinate (TOPROL-XL) 25 mg Oral Tablet Sustained Release 24 hr Take 0.5 Tablets (12.5 mg total) by mouth Once a day    multivitamin Oral Tablet Take 1 Tablet by mouth Once a day    nitroGLYCERIN (NITROSTAT) 0.4 mg Sublingual Tablet, Sublingual Place 1 Tablet (0.4 mg total) under the tongue Every 5 minutes as needed for Chest pain for 3 doses over 15 minutes    repaglinide (PRANDIN) 0.5 mg Oral Tablet Take 1 Tablet (0.5 mg total) by mouth Three times daily before meals    spironolactone (ALDACTONE) 25 mg Oral Tablet Take 1 Tablet (25 mg total) by mouth Every morning with breakfast     vitamin B complex Oral Tablet Take 1 Tablet by mouth Once a day     Allergies   Allergen Reactions    Adhesive Tape-Silicones Rash     Past Medical History:   Diagnosis Date    Arthropathy     Congestive heart failure (CMS HCC)     Coronary artery disease     Diabetes mellitus, type 2 (CMS HCC)     HTN (hypertension)     Ischemic cardiomyopathy 02/07/2023    S/P balloon mitral valvuloplasty 02/07/2023    S/p TAVR (transcatheter aortic valve replacement), bioprosthetic 02/07/2023    Wears dentures          Past Surgical History:   Procedure Laterality Date    CORONARY ARTERY ANGIOPLASTY      HX ADENOIDECTOMY      HX AORTIC VALVE REPLACEMENT      TAVR    HX APPENDECTOMY      HX CATARACT REMOVAL      HX CHOLECYSTECTOMY      HX HEART CATHETERIZATION Left     HX HYSTERECTOMY      HX TONSILLECTOMY      PERCUTANEOUS BALLOON VALVULOPLASTY  07/09/2020         Family History  Family Medical History:    Family history is unknown by patient.         Social History  Social History     Socioeconomic History    Marital status: Divorced   Tobacco Use    Smoking status: Former     Current packs/day: 0.00     Types: Cigarettes     Quit date: 1950     Years since quitting: 74.8     Passive exposure: Past    Smokeless tobacco: Never   Vaping Use    Vaping status: Never Used   Substance and Sexual Activity    Alcohol use: Never    Drug use: Never     Social Determinants of Health     Social Connections: Low Risk  (02/06/2023)    Social Connections     SDOH Social Isolation: 5 or more times a week     Review of Systems   All review of systems negative except as otherwise noted in the HPI.    PHYSICAL EXAMINATION:   BP 128/68 (Site: Left Arm, Patient Position: Sitting, Cuff Size: Adult)   Pulse 61   Temp 36.4 C (97.5 F) (Thermal Scan)   Resp 20   Ht 1.499 m (4\' 11" )   SpO2 92%   BMI 20.22 kg/m  GENERAL: Patient is alert, oriented to time, place and person. No acute distress.  HEENT:  Head is normocephalic, atraumatic.   Extraocular movements are intact.  Sclerae are nonicteric.   NECK:  Supple, without JVD or carotid bruits.   LUNGS:  Breath sounds reveal crackles to auscultation bilaterally. Normal bilateral lung expansion.  CARDIOVASCULAR:  Reveals regular rhythm with normal S1 and S2. No murmur, rub or gallop present.  ABDOMEN:  Soft and non-distended.  LOWER EXTREMITIES:  Appear well-perfused without edema.    CARDIOVASCULAR DIAGNOSTIC TESTING    Transthoracic Echocardiogram (02/06/23):   Conclusions:  Markedly abnormal echocardiographic study and unfortunately the images were technically difficult.     The aortic valve appears to be a bioprosthesis. In one view there was a clear space near the valve plane which most likely what is artifactual but I can not completely exclude fluid or abscess. There  does not appear to be dehiscence of the valve plane in this area. There is trivial aortic regurgitation     The mitral valve is heavily calcified with markedly decreased leaflet mobility. I can not tell if this is a native valve or a prosthetic valve. There appears to be severe stenosis with an estimated  valve area of 0.5 cm squared. The mean gradient is estimated at 10 mmHg. There is trivial to mild regurgitation     The tricuspid and pulmonic valves appear structurally normal.     The left ventricle is abnormal. There is at least mild LVH. The septum is sigmoid in appearance. Most wall segments contract normally with an exception of a discrete area of dilatation and dyskinesis  of the apex. In most views this appears to be consistent with an aneurysm however in two views with definity there was delayed filling and delayed emptying suggesting a narrowed neck structure raising  the suspicion of a pseudoaneurysm. There did not appear to be any extravasation of contrast into the pericardial space. Ejection fraction is estimated at 50%  Left atrium is moderately to severely enlarged  There is mild RVH with mildly depressed right  ventricular systolic function. The right atrium is normal  Interatrial and interventricular septa appear intact  There was no pericardial effusion nor intracardiac mass.  Pulmonary pressures are at least moderately elevated    Coronary CTA (n/a):    Stress Testing (n/a):    Cardiac Cath (n/a):    The ASCVD Risk score (Arnett DK, et al., 2019) failed to calculate for the following reasons:    The 2019 ASCVD risk score is only valid for ages 21 to 30    Lab Results   Component Value Date    TRIG 69 05/11/2023    HDLCHOL 41 (L) 05/11/2023    LDLCHOL 49 05/11/2023    CHOLESTEROL 105 05/11/2023        Lab Results   Component Value Date    SODIUM 141 05/11/2023    POTASSIUM 4.3 05/11/2023    BUN 18 05/11/2023    CREATININE 0.76 05/11/2023    GFR 73 05/11/2023         ASSESSMENT AND PLAN:  In brief summary, she is a very pleasant 89 y.o.  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     Chronic systolic and diastolic heart failure;  Unknown etiology  Echocardiogram revealed insufficient imaging  Obtain echocardiogram to better assess cardiac structure and function  NYHA Functional Class:  I-II  Volume status: warm an dry  Will obtain a pro-BNP today to assess prognosis and risk stratification.  Will get a BMP today to evaluate kidney function and electrolytes status.  Patient with HFrEF will benefit from being in GDMT with ARNI/ACEI/ARB, long acting beta blocker therapy, SGLT-2 inhibitors.   Considering initiation of ARNI  Diuretic consideration with loop diuretic: Furosemide 20 mg prn.  Since patient has NYHA functional class II-IV and is already on BB, will consider initiation of an Aldosterone antagonist such as Spironolactone 25 mg.   Goal systolic Blood pressure control is <130 mmHg.  Counseled to monitor daily weight in am (annotate) and fluid intake. Limit fluid intake to 1.5-2 lts daily.   Counseled on a  low-sodium/Mediterranean diet. Keep a low-potassium diet.  Request prior cardiology records    Lower extremity edema  Decrease Lasix from daily to prn  Start spironolactone 25 mg daily  Wear compression stockings  Counseled on lifestyle modification with exercise as tolerated for at least 63min/wk of high intensity activity and Mediterranean diet.   Counseled on low-salt diet as well.  Counseled about cessation of smoking as the best intervention to reduced cardiovascular risk.      Follow up in one month    I am scribing for, and in the presence of, Dr. Genice Rouge for services provided on 07/03/2023.  Geanie Cooley, SCRIBE      Sharrell Ku, MD [facility]  Assistant Professor  Cardiology  [facility]  [facility]     This note may have been partially generated using MModal Fluency Direct system, and there may be some incorrect words, spellings, and punctuation that were not noted in checking the note before saving.     I personally performed the services described in this documentation, as scribed  in my presence, and it is both accurate  and complete.    Cheri Rous, MD

## 2023-07-03 ENCOUNTER — Other Ambulatory Visit: Payer: Self-pay

## 2023-07-03 ENCOUNTER — Other Ambulatory Visit (HOSPITAL_COMMUNITY): Payer: Medicare PPO

## 2023-07-03 ENCOUNTER — Encounter (INDEPENDENT_AMBULATORY_CARE_PROVIDER_SITE_OTHER): Payer: Self-pay | Admitting: INTERNAL MEDICINE - CARDIOVASCULAR DISEASE

## 2023-07-03 ENCOUNTER — Ambulatory Visit
Payer: Medicare PPO | Attending: INTERNAL MEDICINE - CARDIOVASCULAR DISEASE | Admitting: INTERNAL MEDICINE - CARDIOVASCULAR DISEASE

## 2023-07-03 VITALS — BP 128/68 | HR 61 | Temp 97.5°F | Resp 20 | Ht 59.0 in

## 2023-07-03 DIAGNOSIS — I255 Ischemic cardiomyopathy: Secondary | ICD-10-CM | POA: Insufficient documentation

## 2023-07-03 DIAGNOSIS — Z9889 Other specified postprocedural states: Secondary | ICD-10-CM

## 2023-07-03 DIAGNOSIS — Z953 Presence of xenogenic heart valve: Secondary | ICD-10-CM | POA: Insufficient documentation

## 2023-07-03 DIAGNOSIS — I5042 Chronic combined systolic (congestive) and diastolic (congestive) heart failure: Secondary | ICD-10-CM | POA: Insufficient documentation

## 2023-07-03 DIAGNOSIS — Z7182 Exercise counseling: Secondary | ICD-10-CM | POA: Insufficient documentation

## 2023-07-03 DIAGNOSIS — R600 Localized edema: Secondary | ICD-10-CM | POA: Insufficient documentation

## 2023-07-03 DIAGNOSIS — Z713 Dietary counseling and surveillance: Secondary | ICD-10-CM | POA: Insufficient documentation

## 2023-07-03 DIAGNOSIS — I110 Hypertensive heart disease with heart failure: Secondary | ICD-10-CM | POA: Insufficient documentation

## 2023-07-03 DIAGNOSIS — E119 Type 2 diabetes mellitus without complications: Secondary | ICD-10-CM | POA: Insufficient documentation

## 2023-07-03 DIAGNOSIS — Z79899 Other long term (current) drug therapy: Secondary | ICD-10-CM | POA: Insufficient documentation

## 2023-07-03 LAB — BASIC METABOLIC PANEL
ANION GAP: 11 mmol/L (ref 4–13)
BUN/CREA RATIO: 28 — ABNORMAL HIGH (ref 6–22)
BUN: 25 mg/dL (ref 8–25)
CALCIUM: 9 mg/dL (ref 8.6–10.3)
CHLORIDE: 102 mmol/L (ref 96–111)
CO2 TOTAL: 23 mmol/L (ref 23–31)
CREATININE: 0.9 mg/dL (ref 0.60–1.05)
ESTIMATED GFR - FEMALE: 59 mL/min/BSA — ABNORMAL LOW (ref >=60)
GLUCOSE: 166 mg/dL — ABNORMAL HIGH (ref 65–125)
POTASSIUM: 4 mmol/L (ref 3.5–5.1)
SODIUM: 136 mmol/L (ref 136–145)

## 2023-07-03 LAB — CBC
HCT: 29.6 % — ABNORMAL LOW (ref 34.8–46.0)
HGB: 8.8 g/dL — ABNORMAL LOW (ref 11.5–16.0)
MCH: 27 pg (ref 26.0–32.0)
MCHC: 29.7 g/dL — ABNORMAL LOW (ref 31.0–35.5)
MCV: 90.8 fL (ref 78.0–100.0)
MPV: 9.1 fL (ref 8.7–12.5)
PLATELETS: 439 10*3/uL — ABNORMAL HIGH (ref 150–400)
RBC: 3.26 10*6/uL — ABNORMAL LOW (ref 3.85–5.22)
RDW-CV: 20 % — ABNORMAL HIGH (ref 11.5–15.5)
WBC: 10.5 10*3/uL (ref 3.7–11.0)

## 2023-07-03 LAB — B-TYPE NATRIURETIC PEPTIDE (BNP),PLASMA: BNP: 2213 pg/mL — ABNORMAL HIGH (ref <=99)

## 2023-07-03 MED ORDER — SPIRONOLACTONE 25 MG TABLET
25.0000 mg | ORAL_TABLET | Freq: Every morning | ORAL | 3 refills | Status: AC
Start: 2023-07-03 — End: ?

## 2023-07-04 ENCOUNTER — Encounter (INDEPENDENT_AMBULATORY_CARE_PROVIDER_SITE_OTHER): Payer: Self-pay | Admitting: INTERNAL MEDICINE - CARDIOVASCULAR DISEASE

## 2023-07-04 MED ORDER — FUROSEMIDE 20 MG TABLET
40.0000 mg | ORAL_TABLET | Freq: Every day | ORAL | 2 refills | Status: AC
Start: 2023-07-04 — End: ?

## 2023-07-11 ENCOUNTER — Other Ambulatory Visit: Payer: Self-pay

## 2023-07-11 ENCOUNTER — Inpatient Hospital Stay
Admission: RE | Admit: 2023-07-11 | Discharge: 2023-07-11 | Disposition: A | Discharge status: Home / Self Care | Payer: Medicare PPO | Source: Ambulatory Visit | Attending: INTERNAL MEDICINE - CARDIOVASCULAR DISEASE | Admitting: INTERNAL MEDICINE - CARDIOVASCULAR DISEASE

## 2023-07-11 DIAGNOSIS — I255 Ischemic cardiomyopathy: Secondary | ICD-10-CM | POA: Insufficient documentation

## 2023-07-11 DIAGNOSIS — Z9889 Other specified postprocedural states: Secondary | ICD-10-CM | POA: Insufficient documentation

## 2023-07-11 DIAGNOSIS — Z953 Presence of xenogenic heart valve: Secondary | ICD-10-CM | POA: Insufficient documentation

## 2023-07-11 MED ORDER — SODIUM CHLORIDE 0.9 % INJECTION SOLUTION
2.0000 mL | Freq: Once | INTRAVENOUS | Status: DC | PRN
Start: 2023-07-11 — End: 2023-07-11
  Filled 2023-07-11: qty 2

## 2023-07-19 LAB — TRANSTHORACIC ECHOCARDIOGRAM - ADULT
AV LVOT peak gradient: 3 mm[Hg]
AV mean gradient: 2 mm[Hg]
Ao VTI: 14.2 cm
Ao peak vel: 93.8 cm/s
Ao root annulus: 2.9 cm
Ao root annulus: 2.9 cm
Aortic Valve Area by Continuity of Peak Velocity: 2.93 cm2
Aortic Valve Area by Continuity of VTI: 3.12 cm2
Ascending aorta: 2.9 cm
E wave decelartion time: 180 ms
E/A ratio: 0.8
E/E' ratio: 56
Inferior Vena Cava Diameter: 2.35 cm
Inferior Vena Cava Diameter: 2.35 cm
LA Volume Index: 61.5 mL/m2
LA size: 4.5 cm
LA size: 4.5 cm
LA volume: 80.2 cm3
LVOT diameter: 2 cm
LVOT stroke volume: 44 cm3
Left Atrium to Aortic Root Ratio: 1.55
Left Ventricular Outflow Tract Peak Velocity: 87.5 cm/s
MR PISA EROA: 0.08 cm2
MV Comp VTI: 63.4 cm
MV Peak A Vel: 226 cm/s
MV Peak E Vel: 177 cm/s
MV mean gradient: 11 mm[Hg]
MV regurgitant volume: 12 cm3
MV stenosis pressure 1/2 time: 136 ms
Mitral Regurgitant Velocity Time Integral: 152 cm
Mitral Valve Max Velocity: 229 cm/s
Mr max vel: 525 cm/s
PV mean gradient: 0 mm[Hg]
PV peak gradient: 1 mm[Hg]
Pulmonic Regurgitant Max Peak Gradiant: 17 mm[Hg]
Pulmonic Valve Acceleration Time: 63 ms
Pulmonic Valve Max Velocity: 54 cm/s
Pulmonic Valve Systolic Velocity Time Integral: 6.76 cm
RVDD: 3.37 cm
RVDD: 3.37 cm
Right Atrium Pressure: 15 mm[Hg]
TDI: 3.16 cm/s
TDI: 3.16 cm/s

## 2023-07-20 ENCOUNTER — Telehealth (INDEPENDENT_AMBULATORY_CARE_PROVIDER_SITE_OTHER): Payer: Self-pay | Admitting: INTERNAL MEDICINE - CARDIOVASCULAR DISEASE

## 2023-07-20 NOTE — Telephone Encounter (Signed)
Dr Genice Rouge requested an appointment to review Echo results with the patient. Message was left for a return call. Would like to offer an appointment with Dr Genice Rouge tomorrow at 11:30am if that would work for them.     Rayetta Humphrey, LPN

## 2023-07-20 NOTE — Telephone Encounter (Signed)
-----   Message from Cheri Rous sent at 07/20/2023  9:29 AM EST -----  Bring her back to discuss results. Thanks Please call patient and notify.     Thanks    Jesusita Oka

## 2023-07-21 ENCOUNTER — Encounter (INDEPENDENT_AMBULATORY_CARE_PROVIDER_SITE_OTHER): Payer: Self-pay | Admitting: INTERNAL MEDICINE - CARDIOVASCULAR DISEASE

## 2023-07-21 ENCOUNTER — Ambulatory Visit (INDEPENDENT_AMBULATORY_CARE_PROVIDER_SITE_OTHER)
Payer: Self-pay | Attending: INTERNAL MEDICINE - CARDIOVASCULAR DISEASE | Admitting: INTERNAL MEDICINE - CARDIOVASCULAR DISEASE

## 2023-07-21 ENCOUNTER — Ambulatory Visit (INDEPENDENT_AMBULATORY_CARE_PROVIDER_SITE_OTHER): Payer: Self-pay | Admitting: INTERNAL MEDICINE - CARDIOVASCULAR DISEASE

## 2023-07-21 ENCOUNTER — Other Ambulatory Visit: Payer: Self-pay

## 2023-07-21 ENCOUNTER — Telehealth (INDEPENDENT_AMBULATORY_CARE_PROVIDER_SITE_OTHER): Payer: Self-pay | Admitting: INTERNAL MEDICINE - CARDIOVASCULAR DISEASE

## 2023-07-21 DIAGNOSIS — Z953 Presence of xenogenic heart valve: Secondary | ICD-10-CM | POA: Insufficient documentation

## 2023-07-21 DIAGNOSIS — Z794 Long term (current) use of insulin: Secondary | ICD-10-CM | POA: Insufficient documentation

## 2023-07-21 DIAGNOSIS — E119 Type 2 diabetes mellitus without complications: Secondary | ICD-10-CM | POA: Insufficient documentation

## 2023-07-21 DIAGNOSIS — Z9889 Other specified postprocedural states: Secondary | ICD-10-CM | POA: Insufficient documentation

## 2023-07-21 DIAGNOSIS — I050 Rheumatic mitral stenosis: Secondary | ICD-10-CM | POA: Insufficient documentation

## 2023-07-21 NOTE — Telephone Encounter (Signed)
-----   Message from Cheri Rous sent at 07/20/2023  9:29 AM EST -----  Bring her back to discuss results. Thanks Please call patient and notify.     Thanks    Jesusita Oka

## 2023-07-21 NOTE — Telephone Encounter (Signed)
Telephone visit with Dr Genice Rouge was conducted today to review test results.     Rayetta Humphrey, LPN

## 2023-07-21 NOTE — Telephone Encounter (Signed)
Message left for the patient 's son to call the office to set up an appointment to review the patients Echo results    Rayetta Humphrey, LPN

## 2023-07-21 NOTE — Progress Notes (Signed)
[facility]  [facility]  CARDIOLOGY, [address]  [place] [address]     PATIENT NAME: Alicia Moody NUMBER: [id number]  DATE OF SERVICE: 07/23/23  DATE OF BIRTH: 25-Jan-1926  Age: 89 y.o.    Requesting Physician: Self, Referral     CLINIC NOTE    TELEMEDICINE DOCUMENTATION:    Patient Location:  MyChart video visit from [facility]     Patient/family aware of provider location:  yes  Patient/family consent for telemedicine:  yes  Examination observed and performed by:  Cheri Rous, MD      CHIEF COMPLAINT:  follow up     SUBJECTIVE:    Today I had the pleasure of speaking with Mrs Alicia Moody son via telephone at [facility]. [facility] outpatient clinic in consultation for follow up.    As you know, she is a very pleasant 89 y.o.  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    She was unavailable during the phone conversation. Her son is her full time caregiver and was able to speak on her behalf. He states she was sleeping due to increased fatigue and not sleeping well at night.   He voiced concern regarding her worsening bilateral lower extremity edema and confusion on medication regimen for control. She has been compliant with use of compression stockings with little effect. He was instructed to give spironolactone 25 mg daily and furosemide 20 mg b.i.d and to monitor weight weight daily. She continues to take metolazone as needed. Weekly adjustments will be made to her lasix regimen based upon weight monitoring and present symptoms. Her recent echocardiogram from 07/11/23 revealed normal function related to her prosthetic aortic valve and severe mitral valve stenosis. She had a valvuloplasty done in June at [facility] but records are not available at this time and are being requested. It was  recommended to follow up with [facility] for reassessment of mitral valve s/p angioplasty but her son states this is too far for her to travel so they were recommended to Dr. Mayford Knife for evaluation. Her labs revealed amenia based on Hgb of 8.8, and BNP has increased from 811 to 2213 within a two month period. He notes the plan is for her to remain at home with him as her primary caregiver at this time and counseling was provided regarding decisions related to her high risk of a cardiac event. He is compliant to bring her for an in office visit on Monday for further assessment of symptoms.    Currently, patient/son deny chest pain, syncope, palpitations, dyspnea on exertion, orthopnea, intermittent claudication.  Denies hospitalizations or ER visits due to cardiac-related comorbidities.    Patient denies use of recreational drugs, ETOH abuse or cigarette smoking. Patient admits to be compliant with medications and diet.                            Problem List     Patient Active Problem List   Diagnosis    Acute on chronic respiratory failure with hypoxia (CMS HCC)    Acute on chronic congestive heart failure (CMS HCC)    Bradycardia    COPD (chronic obstructive pulmonary disease) (CMS HCC)    DM2 (diabetes mellitus, type 2) (CMS HCC)    Aortic stenosis  S/p TAVR (transcatheter aortic valve replacement), bioprosthetic    S/P balloon mitral valvuloplasty    Ischemic cardiomyopathy     Past History  Current Outpatient Medications   Medication Sig    ascorbic acid, vitamin C, (VITAMIN C) 500 mg Oral Tablet Take 1 Tablet (500 mg total) by mouth Once a day    atorvastatin (LIPITOR) 10 mg Oral Tablet Take 1 Tablet (10 mg total) by mouth Every evening    cholecalciferol, vitamin D3, 25 mcg (1,000 unit) Oral Tablet Take 1 Tablet (1,000 Units total) by mouth Once a day    cyclobenzaprine (FLEXERIL) 5 mg Oral Tablet Take 1 Tablet (5 mg total) by mouth Three times a day as needed for Muscle spasms    ferrous sulfate (FERATAB) 324 mg  (65 mg iron) Oral Tablet, Delayed Release (E.C.) Take 1 Tablet (324 mg total) by mouth    furosemide (LASIX) 20 mg Oral Tablet Take 2 Tablets (40 mg total) by mouth Once a day    insulin glargine 100 unit/mL Subcutaneous injection (vial) Inject 10 Units under the skin Every night    metOLazone (ZAROXOLYN) 2.5 mg Oral Tablet Take 1 Tablet (2.5 mg total) by mouth Once per day as needed    metoprolol succinate (TOPROL-XL) 25 mg Oral Tablet Sustained Release 24 hr Take 0.5 Tablets (12.5 mg total) by mouth Once a day    multivitamin Oral Tablet Take 1 Tablet by mouth Once a day    nitroGLYCERIN (NITROSTAT) 0.4 mg Sublingual Tablet, Sublingual Place 1 Tablet (0.4 mg total) under the tongue Every 5 minutes as needed for Chest pain for 3 doses over 15 minutes    repaglinide (PRANDIN) 0.5 mg Oral Tablet Take 1 Tablet (0.5 mg total) by mouth Three times daily before meals    spironolactone (ALDACTONE) 25 mg Oral Tablet Take 1 Tablet (25 mg total) by mouth Every morning with breakfast    vitamin B complex Oral Tablet Take 1 Tablet by mouth Once a day     Allergies   Allergen Reactions    Adhesive Tape-Silicones Rash     Past Medical History:   Diagnosis Date    Arthropathy     Congestive heart failure (CMS HCC)     Coronary artery disease     Diabetes mellitus, type 2 (CMS HCC)     HTN (hypertension)     Ischemic cardiomyopathy 02/07/2023    S/P balloon mitral valvuloplasty 02/07/2023    S/p TAVR (transcatheter aortic valve replacement), bioprosthetic 02/07/2023    Wears dentures          Past Surgical History:   Procedure Laterality Date    CORONARY ARTERY ANGIOPLASTY      HX ADENOIDECTOMY      HX AORTIC VALVE REPLACEMENT      TAVR    HX APPENDECTOMY      HX CATARACT REMOVAL      HX CHOLECYSTECTOMY      HX HEART CATHETERIZATION Left     HX HYSTERECTOMY      HX TONSILLECTOMY      PERCUTANEOUS BALLOON VALVULOPLASTY  07/09/2020         Family History  Family Medical History:    Family history is unknown by patient.          Social History  Social History     Socioeconomic History    Marital status: Divorced   Tobacco Use    Smoking status: Former     Current packs/day: 0.00  Types: Cigarettes     Quit date: 1950     Years since quitting: 74.9     Passive exposure: Past    Smokeless tobacco: Never   Vaping Use    Vaping status: Never Used   Substance and Sexual Activity    Alcohol use: Never    Drug use: Never     Social Determinants of Health     Social Connections: Low Risk  (02/06/2023)    Social Connections     SDOH Social Isolation: 5 or more times a week     Review of Systems   All review of systems negative except as otherwise noted in the HPI.    PHYSICAL EXAMINATION:   There were no vitals taken for this visit.    CARDIOVASCULAR DIAGNOSTIC TESTING    Transthoracic Echocardiogram (07/11/23):   Conclusions:  - TAVR Evolute pro +26 mm implanted 07/2020. The valve appears well-seated. AVA 2.86cm2. Dimensionless index 0.93. Vmax 0.9 m/s, mean gradient 2 mmHg Trivial peri-valvular regurgitation. There is no  evidence of paravalvular aortic regurgitation. There is a hypoechoic/radiolucent area around the valve, most noticeable from the 10-1 o'clock position in short axis. Differentials include prominent  aortic SoV, abscess. The radiolucency measures 0.8cm. No evidence of blood flow by Doppler within this area.  - Normal left ventricular size. LVEDV 62.18mL; indexed to 44.24mL/m2. Normal geometry. Ejection Fraction is 52.2 %. Mildly depressed left ventricular systolic function. There is flattening of the  interventricular septum in both systole and diastole, consistent with right ventricular pressure and volume overload. Left ventricular diastolic function could not be assessed due to the presence of  Severe Mitral annular calcification.  - The right ventricle appears severely dilated as compared to the left ventricle. Severely depressed right ventricular systolic function. Attempted FAC from short axis 17.3%. RV systolic  pressure is  consistent with severe pulmonary hypertension.  - Severely dilated left atrium. Left atrium volume index: 59.6 ml/m^42ml/m2.  - There is severe mitral stenosis. The estimated mean gradient across the mitral valve is 11 mmHg. MVA by VTI 0.70cm2. MV Vmax is 5.2 m/s. The mitral valve area using the pressure half-time is 1.6  cm2. There is mild mitral regurgitation. The mitral regurgitation jet is central. History of mitral valvuloplasty 02/14/23.     Findings  Left Ventricle:   Normal left ventricular size. LVEDV 62.57mL; indexed to 44.90mL/m2. Normal geometry. Ejection Fraction is 52.2 %. Mildly depressed left ventricular systolic function. There is  flattening of the interventricular septum in both systole and diastole, consistent with right ventricular pressure and volume overload. Regional wall motion abnormalities cannot be excluded due to  limited visualization. Left ventricular diastolic function could not be assessed due to the presence of Severe Mitral annular calcification.  Right Ventricle:   The right ventricle appears severely dilated as compared to the left ventricle. The right ventricle is not well visualized. Severely depressed right ventricular systolic function.  Attempted FAC from short axis 17.3%. RV systolic pressure is consistent with severe pulmonary hypertension.  Left Atrium:   Severely dilated left atrium. Left atrium volume index: 59.6 ml/m^14ml/m2.  Right Atrium:   The right atrium is of normal size.  Mitral Valve:   Moderate mitral valve leaflet calcification. There is restricted coaptation of the anterior and posterior mitral leaflets. Severe mitral annular calcification. There is severe mitral  stenosis. The estimated mean gradient across the mitral valve is 11 mmHg. MVA by VTI 0.70cm2. MV Vmax is 5.2 m/s. The mitral valve area using  the pressure half-time is 1.6 cm2. There is mild mitral  regurgitation. The mitral regurgitation jet is central. History of mitral valvuloplasty  02/14/23.  Tricuspid Valve:   There is mild tricuspid regurgitation. Multiple small jets noted.  Aortic Valve:   TAVR Evolute pro +26 mm implanted 07/2020. The valve appears well-seated. AVA 2.86cm2. Dimensionless index 0.93. Vmax 0.9 m/s, mean gradient 2 mmHg Trivial peri-valvular regurgitation.  There is no evidence of paravalvular aortic regurgitation. There is a hypoechoic/radiolucent area around the valve, most noticeable from the 10-1 o'clock position in short axis. The radiolucency  measures 0.8cm. No evidence of blood flow by Doppler within this area.  Pulmonic Valve:   There is mild pulmonic regurgitation.  Atrial Septum:   The interatrial septum is normal in appearance.  IVC/Hepatic Veins:   Dilated IVC with <50% inspiratory collapse (estimated RA pressure: 15 mmHg).  Aorta:   The aortic root is of normal size. The ascending aorta is normal in size. Unable to image the arch. Patient unable to continue with exam due to pain.  Pericardium/Pleural space:   Normal pericardium with no pericardial effusion.     Electronically signed by: MD Cheri Rous on 07/19/2023 12:30 PM    Transthoracic Echocardiogram (02/06/23):   Conclusions:  Markedly abnormal echocardiographic study and unfortunately the images were technically difficult.     The aortic valve appears to be a bioprosthesis. In one view there was a clear space near the valve plane which most likely what is artifactual but I can not completely exclude fluid or abscess. There  does not appear to be dehiscence of the valve plane in this area. There is trivial aortic regurgitation     The mitral valve is heavily calcified with markedly decreased leaflet mobility. I can not tell if this is a native valve or a prosthetic valve. There appears to be severe stenosis with an estimated  valve area of 0.5 cm squared. The mean gradient is estimated at 10 mmHg. There is trivial to mild regurgitation     The tricuspid and pulmonic valves appear structurally  normal.     The left ventricle is abnormal. There is at least mild LVH. The septum is sigmoid in appearance. Most wall segments contract normally with an exception of a discrete area of dilatation and dyskinesis  of the apex. In most views this appears to be consistent with an aneurysm however in two views with definity there was delayed filling and delayed emptying suggesting a narrowed neck structure raising  the suspicion of a pseudoaneurysm. There did not appear to be any extravasation of contrast into the pericardial space. Ejection fraction is estimated at 50%  Left atrium is moderately to severely enlarged  There is mild RVH with mildly depressed right ventricular systolic function. The right atrium is normal  Interatrial and interventricular septa appear intact  There was no pericardial effusion nor intracardiac mass.  Pulmonary pressures are at least moderately elevated    Coronary CTA (n/a):    Stress Testing (n/a):    Cardiac Cath (n/a):    The ASCVD Risk score (Arnett DK, et al., 2019) failed to calculate for the following reasons:    The 2019 ASCVD risk score is only valid for ages 60 to 42    Lab Results   Component Value Date    TRIG 69 05/11/2023    HDLCHOL 41 (L) 05/11/2023    LDLCHOL 49 05/11/2023    CHOLESTEROL 105 05/11/2023        Lab  Results   Component Value Date    SODIUM 136 07/03/2023    POTASSIUM 4.0 07/03/2023    BUN 25 07/03/2023    CREATININE 0.90 07/03/2023    GFR 59 (L) 07/03/2023         ASSESSMENT AND PLAN:  In brief summary, she is a very pleasant 89 y.o.  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     Chronic systolic and diastolic heart failure;  Unknown etiology  Ejection Fraction is 52.2 %  NYHA Functional Class: I-II  Volume status: warm an dry  BNP elevated to from 811 to 2213  Patient with HFrEF will benefit from being in GDMT with ARNI/ACEI/ARB, long acting beta  blocker therapy, SGLT-2 inhibitors.   Continue lasix 20 mg b.i.d.  Continue metolazone 2.5 mg prn  Since patient has NYHA functional class II-IV and is already on BB, will consider initiation of an Aldosterone antagonist  Continue spironolactone 25 mg daily  Goal systolic Blood pressure control is <130 mmHg.  Counseled to monitor daily weight in am (annotate) and fluid intake. Limit fluid intake to 1.5-2 lts daily.   Counseled on a low-sodium/Mediterranean diet. Keep a low-potassium diet.  Request prior cardiology records; pending    Lower extremity edema  Continue Lasix 20 mg b.i.d  Continue spironolactone 25 mg daily  Wear compression stockings  Counseled on lifestyle modification with exercise as tolerated for at least 32min/wk of high intensity activity and Mediterranean diet.   Counseled on low-salt diet as well.  Counseled about cessation of smoking as the best intervention to reduced cardiovascular risk.      Follow up in person on 07/24/23    I am scribing for, and in the presence of, Dr. Genice Rouge for services provided on 07/21/2023.  Geanie Cooley, SCRIBE      Sharrell Ku, MD [facility]  Assistant Professor  Cardiology  [facility]  [facility]     This note may have been partially generated using MModal Fluency Direct Moody, and there may be some incorrect words, spellings, and punctuation that were not noted in checking the note before saving.     I personally performed the services described in this documentation, as scribed  in my presence, and it is both accurate  and complete.    Cheri Rous, MD

## 2023-07-24 ENCOUNTER — Ambulatory Visit (INDEPENDENT_AMBULATORY_CARE_PROVIDER_SITE_OTHER): Payer: Self-pay | Admitting: INTERNAL MEDICINE - CARDIOVASCULAR DISEASE

## 2023-07-24 ENCOUNTER — Other Ambulatory Visit (HOSPITAL_COMMUNITY): Payer: Self-pay | Admitting: Medical

## 2023-07-24 ENCOUNTER — Telehealth (HOSPITAL_COMMUNITY): Payer: Self-pay | Admitting: Interventional Cardiology

## 2023-07-24 DIAGNOSIS — I5041 Acute combined systolic (congestive) and diastolic (congestive) heart failure: Secondary | ICD-10-CM

## 2023-07-24 DIAGNOSIS — Z9889 Other specified postprocedural states: Secondary | ICD-10-CM

## 2023-07-24 DIAGNOSIS — E1159 Type 2 diabetes mellitus with other circulatory complications: Secondary | ICD-10-CM

## 2023-07-24 NOTE — Telephone Encounter (Signed)
[facility] MEDICINE  STRUCTURAL HEART TEAM  [place], [place]    NAME: Alicia Moody  DOB: October 09, 1925, 89 y.o.  MRN: [id number]    Patient's chart reviewed for referral to [facility].    Reason for referral:   Severe mitral stenosis by prior echocardiogram [I05.0]  - Primary      S/P balloon mitral valvuloplasty [Z98.890]      S/p TAVR (transcatheter aortic valve replacement), bioprosthetic [Z95.3]        Referring provider: Cheri Rous, MD     89 y.o.  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   She had a valvuloplasty done in June at [facility] but records are not available at this time and are being requested. It was recommended to follow up with [facility] for reassessment of mitral valve s/p angioplasty but her son states this is too far for her to travel so they were recommended to Dr. Mayford Knife for evaluation.   _______________________________________  History  Patient  has a past medical history of Arthropathy, Congestive heart failure (CMS HCC), Coronary artery disease, Diabetes mellitus, type 2 (CMS HCC), HTN (hypertension), Ischemic cardiomyopathy (02/07/2023), S/P balloon mitral valvuloplasty (02/07/2023), S/p TAVR (transcatheter aortic valve replacement), bioprosthetic (02/07/2023), and Wears dentures.   Patient  has a past surgical history that includes hx gall bladder surgery/chole; hx hysterectomy; hx appendectomy; hx adenoidectomy; hx tonsillectomy; CORONARY ARTERY ANGIOPLASTY - INITIAL VESSEL; hx cataract removal; hx heart catheterization (Left); Percutaneous balloon valvuloplasty (07/09/2020); and hx aortic valve replacement.    Weight    Wt Readings from Last 1 Encounters:   06/26/23 45.4 kg (100 lb 1.4 oz)       Diagnostics  ECG 02/06/23 Interpretation Summary    Sinus bradycardia  Otherwise normal ECG  No previous ECGs available  Confirmed by Elio Forget ([id number]) on 02/06/2023 11:34:08 AM  Result Data    Result Status Result Component Value Units   Final result [3] Ventricular rate 50 BPM    Atrial Rate 50 BPM    PR Interval 190 ms    QRS Duration 102 ms    QT Interval 500 ms    QTC Calculation 455 ms    Calculated P Axis 58 degrees    Calculated R Axis 62 degrees    Calculated T Axis 44 degrees      Echocardiogram  Results for orders placed during the hospital encounter of 07/11/23    TRANSTHORACIC ECHOCARDIOGRAM - ADULT 07/11/2023  3:43 PM    Name: Alicia Moody, Alicia Moody                               MRN: [id number]               Weight: 107 lb  Study Date: 07/11/2023 02:07 PM                         DOB: 1925/09/02             Height: 59 in  Gender: Female                                          Age: 14 yrs  BSA: 1.4 m2  Accession #: [id number]  Patient Location: [facility] NON INVASIVE CARD [facility]  Ordering Provider: Cheri Rous  Tech: Devoria Albe    Procedure:  Transthoracic complete echo, 2D, spectral and tissue Doppler, color flow Doppler, M-mode. 3D Echocardiographic imaging.    Quality:  The study images were of technically adequate quality. Difficult exam due to patient with limited mobility and extreme back/side pain. Patient is unable to tolerate additional images with  Definity, as LLD position and supine position are both very uncomfortable.    Indications: Ischemic cardiomyopathy,S/P balloon mitral valvuloplasty,S/p TAVR (transcatheter aortic valve replacement), bioprosthetic    Conclusions:  - TAVR Evolute pro +26 mm implanted 07/2020. The valve appears well-seated. AVA 2.86cm2. Dimensionless index 0.93. Vmax 0.9 m/s, mean gradient 2 mmHg Trivial peri-valvular regurgitation. There is no  evidence of paravalvular aortic regurgitation. There is a hypoechoic/radiolucent area around the valve, most noticeable from the 10-1 o'clock position in short axis. Differentials include prominent  aortic SoV, abscess. The radiolucency measures  0.8cm. No evidence of blood flow by Doppler within this area.  - Normal left ventricular size. LVEDV 62.37mL; indexed to 44.52mL/m2. Normal geometry. Ejection Fraction is 52.2 %. Mildly depressed left ventricular systolic function. There is flattening of the  interventricular septum in both systole and diastole, consistent with right ventricular pressure and volume overload. Left ventricular diastolic function could not be assessed due to the presence of  Severe Mitral annular calcification.  - The right ventricle appears severely dilated as compared to the left ventricle. Severely depressed right ventricular systolic function. Attempted FAC from short axis 17.3%. RV systolic pressure is  consistent with severe pulmonary hypertension.  - Severely dilated left atrium. Left atrium volume index: 59.6 ml/m^5ml/m2.  - There is severe mitral stenosis. The estimated mean gradient across the mitral valve is 11 mmHg. MVA by VTI 0.70cm2. MV Vmax is 5.2 m/s. The mitral valve area using the pressure half-time is 1.6  cm2. There is mild mitral regurgitation. The mitral regurgitation jet is central. History of mitral valvuloplasty 02/14/23.    Findings  Left Ventricle:   Normal left ventricular size. LVEDV 62.78mL; indexed to 44.91mL/m2. Normal geometry. Ejection Fraction is 52.2 %. Mildly depressed left ventricular systolic function. There is  flattening of the interventricular septum in both systole and diastole, consistent with right ventricular pressure and volume overload. Regional wall motion abnormalities cannot be excluded due to  limited visualization. Left ventricular diastolic function could not be assessed due to the presence of Severe Mitral annular calcification.  Right Ventricle:   The right ventricle appears severely dilated as compared to the left ventricle. The right ventricle is not well visualized. Severely depressed right ventricular systolic function.  Attempted FAC from short axis 17.3%. RV systolic pressure is  consistent with severe pulmonary hypertension.  Left Atrium:   Severely dilated left atrium. Left atrium volume index: 59.6 ml/m^9ml/m2.  Right Atrium:   The right atrium is of normal size.  Mitral Valve:   Moderate mitral valve leaflet calcification. There is restricted coaptation of the anterior and posterior mitral leaflets. Severe mitral annular calcification. There is severe mitral  stenosis. The estimated mean gradient across the mitral valve is 11 mmHg. MVA by VTI 0.70cm2. MV Vmax is 5.2 m/s. The mitral valve area using the pressure half-time is 1.6 cm2. There is mild mitral  regurgitation. The mitral regurgitation jet is central. History of mitral valvuloplasty 02/14/23.  Tricuspid Valve:   There is mild tricuspid regurgitation. Multiple small jets noted.  Aortic Valve:   TAVR Evolute pro +26 mm implanted 07/2020. The valve appears well-seated. AVA 2.86cm2. Dimensionless index 0.93. Vmax 0.9 m/s, mean gradient 2 mmHg Trivial peri-valvular regurgitation.  There is no evidence of paravalvular aortic regurgitation. There is a hypoechoic/radiolucent area around the valve, most noticeable from the 10-1 o'clock position in short axis. The radiolucency  measures 0.8cm. No evidence of blood flow by Doppler within this area.  Pulmonic Valve:   There is mild pulmonic regurgitation.  Atrial Septum:   The interatrial septum is normal in appearance.  IVC/Hepatic Veins:   Dilated IVC with <50% inspiratory collapse (estimated RA pressure: 15 mmHg).  Aorta:   The aortic root is of normal size. The ascending aorta is normal in size. Unable to image the arch. Patient unable to continue with exam due to pain.  Pericardium/Pleural space:   Normal pericardium with no pericardial effusion.    Electronically signed by: MD Cheri Rous on 07/19/2023 12:30 PM       Allergies    Allergies   Allergen Reactions    Adhesive Tape-Silicones Rash       Medications    Current Outpatient Medications:     ascorbic acid, vitamin C,  (VITAMIN C) 500 mg Oral Tablet, Take 1 Tablet (500 mg total) by mouth Once a day, Disp: , Rfl:     atorvastatin (LIPITOR) 10 mg Oral Tablet, Take 1 Tablet (10 mg total) by mouth Every evening, Disp: , Rfl:     cholecalciferol, vitamin D3, 25 mcg (1,000 unit) Oral Tablet, Take 1 Tablet (1,000 Units total) by mouth Once a day, Disp: , Rfl:     cyclobenzaprine (FLEXERIL) 5 mg Oral Tablet, Take 1 Tablet (5 mg total) by mouth Three times a day as needed for Muscle spasms, Disp: 30 Tablet, Rfl: 0    ferrous sulfate (FERATAB) 324 mg (65 mg iron) Oral Tablet, Delayed Release (E.C.), Take 1 Tablet (324 mg total) by mouth, Disp: , Rfl:     furosemide (LASIX) 20 mg Oral Tablet, Take 2 Tablets (40 mg total) by mouth Once a day, Disp: 90 Tablet, Rfl: 2    insulin glargine 100 unit/mL Subcutaneous injection (vial), Inject 10 Units under the skin Every night, Disp: , Rfl:     metOLazone (ZAROXOLYN) 2.5 mg Oral Tablet, Take 1 Tablet (2.5 mg total) by mouth Once per day as needed, Disp: , Rfl:     metoprolol succinate (TOPROL-XL) 25 mg Oral Tablet Sustained Release 24 hr, Take 0.5 Tablets (12.5 mg total) by mouth Once a day, Disp: 30 Tablet, Rfl: 0    multivitamin Oral Tablet, Take 1 Tablet by mouth Once a day, Disp: , Rfl:     nitroGLYCERIN (NITROSTAT) 0.4 mg Sublingual Tablet, Sublingual, Place 1 Tablet (0.4 mg total) under the tongue Every 5 minutes as needed for Chest pain for 3 doses over 15 minutes, Disp: , Rfl:     repaglinide (PRANDIN) 0.5 mg Oral Tablet, Take 1 Tablet (0.5 mg total) by mouth Three times daily before meals, Disp: , Rfl:     spironolactone (ALDACTONE) 25 mg Oral Tablet, Take 1 Tablet (25 mg total) by mouth Every morning with breakfast, Disp: 90 Tablet, Rfl: 3    vitamin B complex Oral Tablet, Take 1 Tablet by mouth Once a day, Disp: , Rfl:     Laboratory  Most recent results available through [facility] are below:  CBC    Lab Results   Component Value Date    WBC 10.5 07/03/2023  HGB 8.8 (L) 07/03/2023    HCT  29.6 (L) 07/03/2023    PLTCNT 439 (H) 07/03/2023        Comprehensive Metabolic Profiles   Lab Results   Component Value Date    SODIUM 136 07/03/2023    POTASSIUM 4.0 07/03/2023    BUN 25 07/03/2023    CREATININE 0.90 07/03/2023    GLUCOSE 166 (H) 07/03/2023    ALBUMIN 3.7 05/11/2023    BNP 2,213 (H) 07/03/2023    HA1C 5.3 05/11/2023        No orders of the defined types were placed in this encounter.      If the pt has teeth please ask them to see a dentist for clearance.   Jene Every, RN  07/24/2023, 12:32

## 2023-07-25 ENCOUNTER — Ambulatory Visit (HOSPITAL_BASED_OUTPATIENT_CLINIC_OR_DEPARTMENT_OTHER): Payer: Self-pay

## 2023-07-26 ENCOUNTER — Other Ambulatory Visit
Admission: RE | Admit: 2023-07-26 | Discharge: 2023-07-26 | Disposition: A | Discharge status: Home / Self Care | Payer: Medicare PPO | Source: Ambulatory Visit | Attending: Medical | Admitting: Medical

## 2023-07-26 DIAGNOSIS — E1159 Type 2 diabetes mellitus with other circulatory complications: Secondary | ICD-10-CM | POA: Insufficient documentation

## 2023-07-26 LAB — MICROALBUMIN/CREATININE RATIO, URINE, RANDOM
CREATININE RANDOM URINE: 35 mg/dL
MICROALBUMIN RANDOM URINE: 1.2 mg/dL
MICROALBUMIN/CREATININE RATIO RANDOM URINE: 34.3 mg/g — ABNORMAL HIGH (ref <30.0)

## 2023-08-01 ENCOUNTER — Ambulatory Visit (INDEPENDENT_AMBULATORY_CARE_PROVIDER_SITE_OTHER): Payer: Self-pay | Admitting: PHYSICIAN ASSISTANT

## 2023-08-01 ENCOUNTER — Telehealth (HOSPITAL_COMMUNITY): Payer: Self-pay | Admitting: Thoracic Surgery (Cardiothoracic Vascular Surgery)

## 2023-08-01 NOTE — Telephone Encounter (Signed)
I attempted to contact this patient to schedule consultation appointments with Cardiac Surgery and Structural Heart. The patient's son informed me that the patient is "indisposed" and will not be able to travel to come to any appointments. She is scheduled for follow up with Shanda Bumps May on 12/3 and Dr. Steve Rattler on 12/30. Notified these providers via staff message.

## 2023-08-28 ENCOUNTER — Ambulatory Visit (INDEPENDENT_AMBULATORY_CARE_PROVIDER_SITE_OTHER): Payer: Self-pay | Admitting: INTERNAL MEDICINE - CARDIOVASCULAR DISEASE
